# Patient Record
Sex: Female | Born: 1955
Health system: Southern US, Community
[De-identification: ages and names within clinical notes are randomized; demographics above are authoritative.]

## PROBLEM LIST (undated history)

## (undated) DIAGNOSIS — F419 Anxiety disorder, unspecified: Secondary | ICD-10-CM

## (undated) DIAGNOSIS — E119 Type 2 diabetes mellitus without complications: Secondary | ICD-10-CM

## (undated) DIAGNOSIS — J45909 Unspecified asthma, uncomplicated: Secondary | ICD-10-CM

## (undated) DIAGNOSIS — I10 Essential (primary) hypertension: Secondary | ICD-10-CM

## (undated) DIAGNOSIS — F329 Major depressive disorder, single episode, unspecified: Secondary | ICD-10-CM

## (undated) DIAGNOSIS — E785 Hyperlipidemia, unspecified: Secondary | ICD-10-CM

## (undated) DIAGNOSIS — K7682 Hepatic encephalopathy: Secondary | ICD-10-CM

## (undated) DIAGNOSIS — J449 Chronic obstructive pulmonary disease, unspecified: Secondary | ICD-10-CM

## (undated) DIAGNOSIS — E559 Vitamin D deficiency, unspecified: Secondary | ICD-10-CM

## (undated) DIAGNOSIS — K7290 Hepatic failure, unspecified without coma: Secondary | ICD-10-CM

## (undated) DIAGNOSIS — K219 Gastro-esophageal reflux disease without esophagitis: Secondary | ICD-10-CM

## (undated) DIAGNOSIS — R413 Other amnesia: Secondary | ICD-10-CM

## (undated) DIAGNOSIS — K7030 Alcoholic cirrhosis of liver without ascites: Secondary | ICD-10-CM

## (undated) DIAGNOSIS — Z5189 Encounter for other specified aftercare: Secondary | ICD-10-CM

## (undated) DIAGNOSIS — I639 Cerebral infarction, unspecified: Secondary | ICD-10-CM

## (undated) DIAGNOSIS — M5412 Radiculopathy, cervical region: Secondary | ICD-10-CM

## (undated) DIAGNOSIS — D649 Anemia, unspecified: Secondary | ICD-10-CM

## (undated) DIAGNOSIS — F1910 Other psychoactive substance abuse, uncomplicated: Secondary | ICD-10-CM

## (undated) DIAGNOSIS — M8580 Other specified disorders of bone density and structure, unspecified site: Secondary | ICD-10-CM

## (undated) DIAGNOSIS — I669 Occlusion and stenosis of unspecified cerebral artery: Secondary | ICD-10-CM

## (undated) DIAGNOSIS — E039 Hypothyroidism, unspecified: Secondary | ICD-10-CM

## (undated) DIAGNOSIS — F32A Depression, unspecified: Secondary | ICD-10-CM

## (undated) HISTORY — DX: Encounter for other specified aftercare: Z51.89

## (undated) HISTORY — DX: Depression, unspecified: F32.A

## (undated) HISTORY — DX: Major depressive disorder, single episode, unspecified: F32.9

## (undated) HISTORY — DX: Gastro-esophageal reflux disease without esophagitis: K21.9

## (undated) HISTORY — DX: Hepatic encephalopathy: K76.82

## (undated) HISTORY — DX: Anemia, unspecified: D64.9

## (undated) HISTORY — DX: Other amnesia: R41.3

## (undated) HISTORY — PX: UPPER GASTROINTESTINAL ENDOSCOPY: SHX188

## (undated) HISTORY — DX: Unspecified asthma, uncomplicated: J45.909

## (undated) HISTORY — DX: Cerebral infarction, unspecified: I63.9

## (undated) HISTORY — DX: Other psychoactive substance abuse, uncomplicated: F19.10

## (undated) HISTORY — DX: Hypothyroidism, unspecified: E03.9

## (undated) HISTORY — DX: Hyperlipidemia, unspecified: E78.5

## (undated) HISTORY — DX: Anxiety disorder, unspecified: F41.9

## (undated) HISTORY — DX: Vitamin D deficiency, unspecified: E55.9

## (undated) HISTORY — PX: CHOLECYSTECTOMY: SHX55

## (undated) HISTORY — DX: Chronic obstructive pulmonary disease, unspecified: J44.9

## (undated) HISTORY — DX: Other specified disorders of bone density and structure, unspecified site: M85.80

## (undated) HISTORY — DX: Radiculopathy, cervical region: M54.12

## (undated) HISTORY — DX: Type 2 diabetes mellitus without complications: E11.9

## (undated) HISTORY — DX: Hepatic failure, unspecified without coma: K72.90

## (undated) HISTORY — PX: POLYPECTOMY: SHX149

## (undated) HISTORY — DX: Essential (primary) hypertension: I10

## (undated) HISTORY — PX: BACK SURGERY: SHX140

## (undated) HISTORY — DX: Alcoholic cirrhosis of liver without ascites: K70.30

## (undated) HISTORY — PX: LIVER BIOPSY: SHX301

## (undated) HISTORY — DX: Occlusion and stenosis of unspecified cerebral artery: I66.9

---

## 2001-01-05 ENCOUNTER — Inpatient Hospital Stay (HOSPITAL_COMMUNITY): Admission: EM | Admit: 2001-01-05 | Discharge: 2001-01-10 | Payer: Self-pay | Admitting: Psychiatry

## 2005-04-18 ENCOUNTER — Ambulatory Visit (HOSPITAL_COMMUNITY): Admission: RE | Admit: 2005-04-18 | Discharge: 2005-04-18 | Payer: Self-pay | Admitting: Neurosurgery

## 2005-06-12 ENCOUNTER — Encounter: Admission: RE | Admit: 2005-06-12 | Discharge: 2005-06-12 | Payer: Self-pay | Admitting: Neurosurgery

## 2005-09-11 ENCOUNTER — Encounter: Admission: RE | Admit: 2005-09-11 | Discharge: 2005-09-11 | Payer: Self-pay | Admitting: Neurosurgery

## 2005-09-13 ENCOUNTER — Encounter: Admission: RE | Admit: 2005-09-13 | Discharge: 2005-09-13 | Payer: Self-pay | Admitting: Neurosurgery

## 2006-12-13 IMAGING — CR DG CHEST 2V
2 series · 2 of 2 positions shown · non-contrast
Comparison: none

<!--  IDXRADR:ADDEND:BEGIN -->Addendum Begins<!--  IDXRADR:ADDEND:INNER_BEGIN -->Original report dictated by Dr. Cheree.  Addendum dictated by Dr. Cheree.
 There is a vague nodular opacity at the medial left lung zone overlying the heart shadow seen only on the frontal view.  Repeat deeper penetrated PA chest x-ray is recommended and if this area persists then CT of the chest may be warranted.

[view not recorded (1 of 2)]
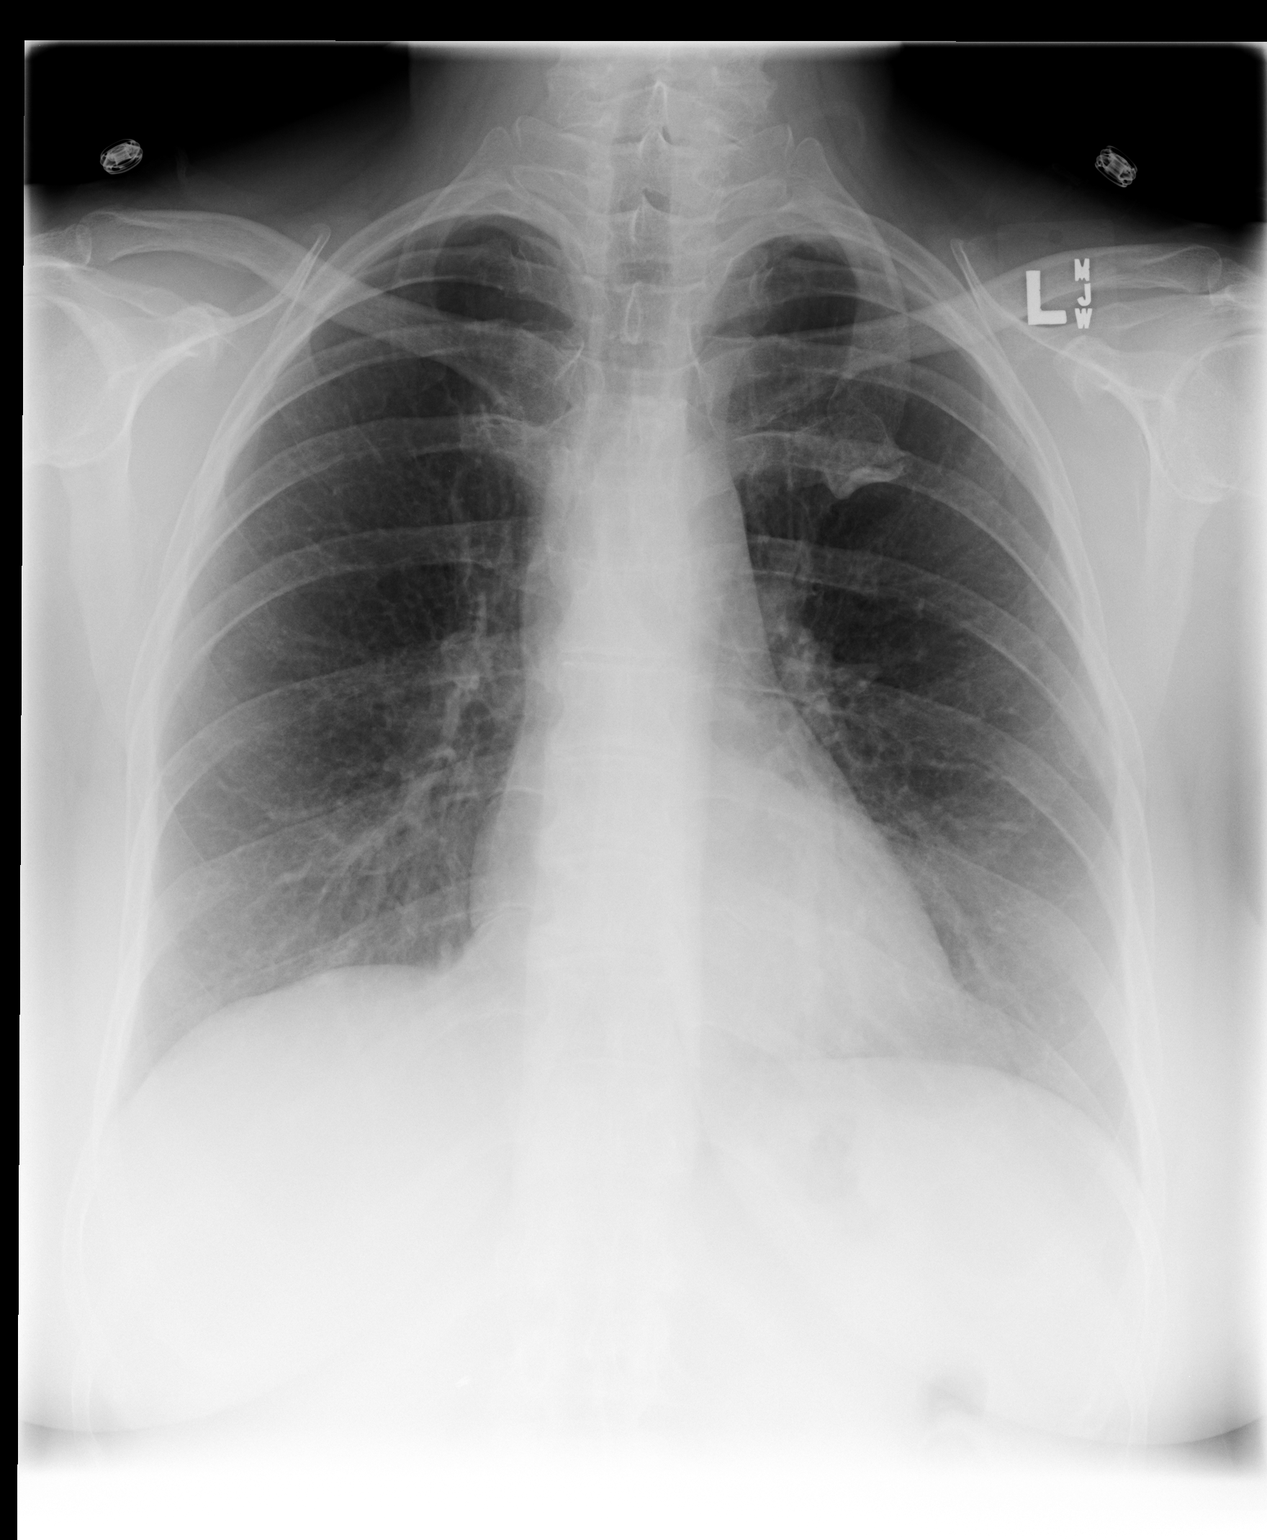

[view not recorded (2 of 2)]
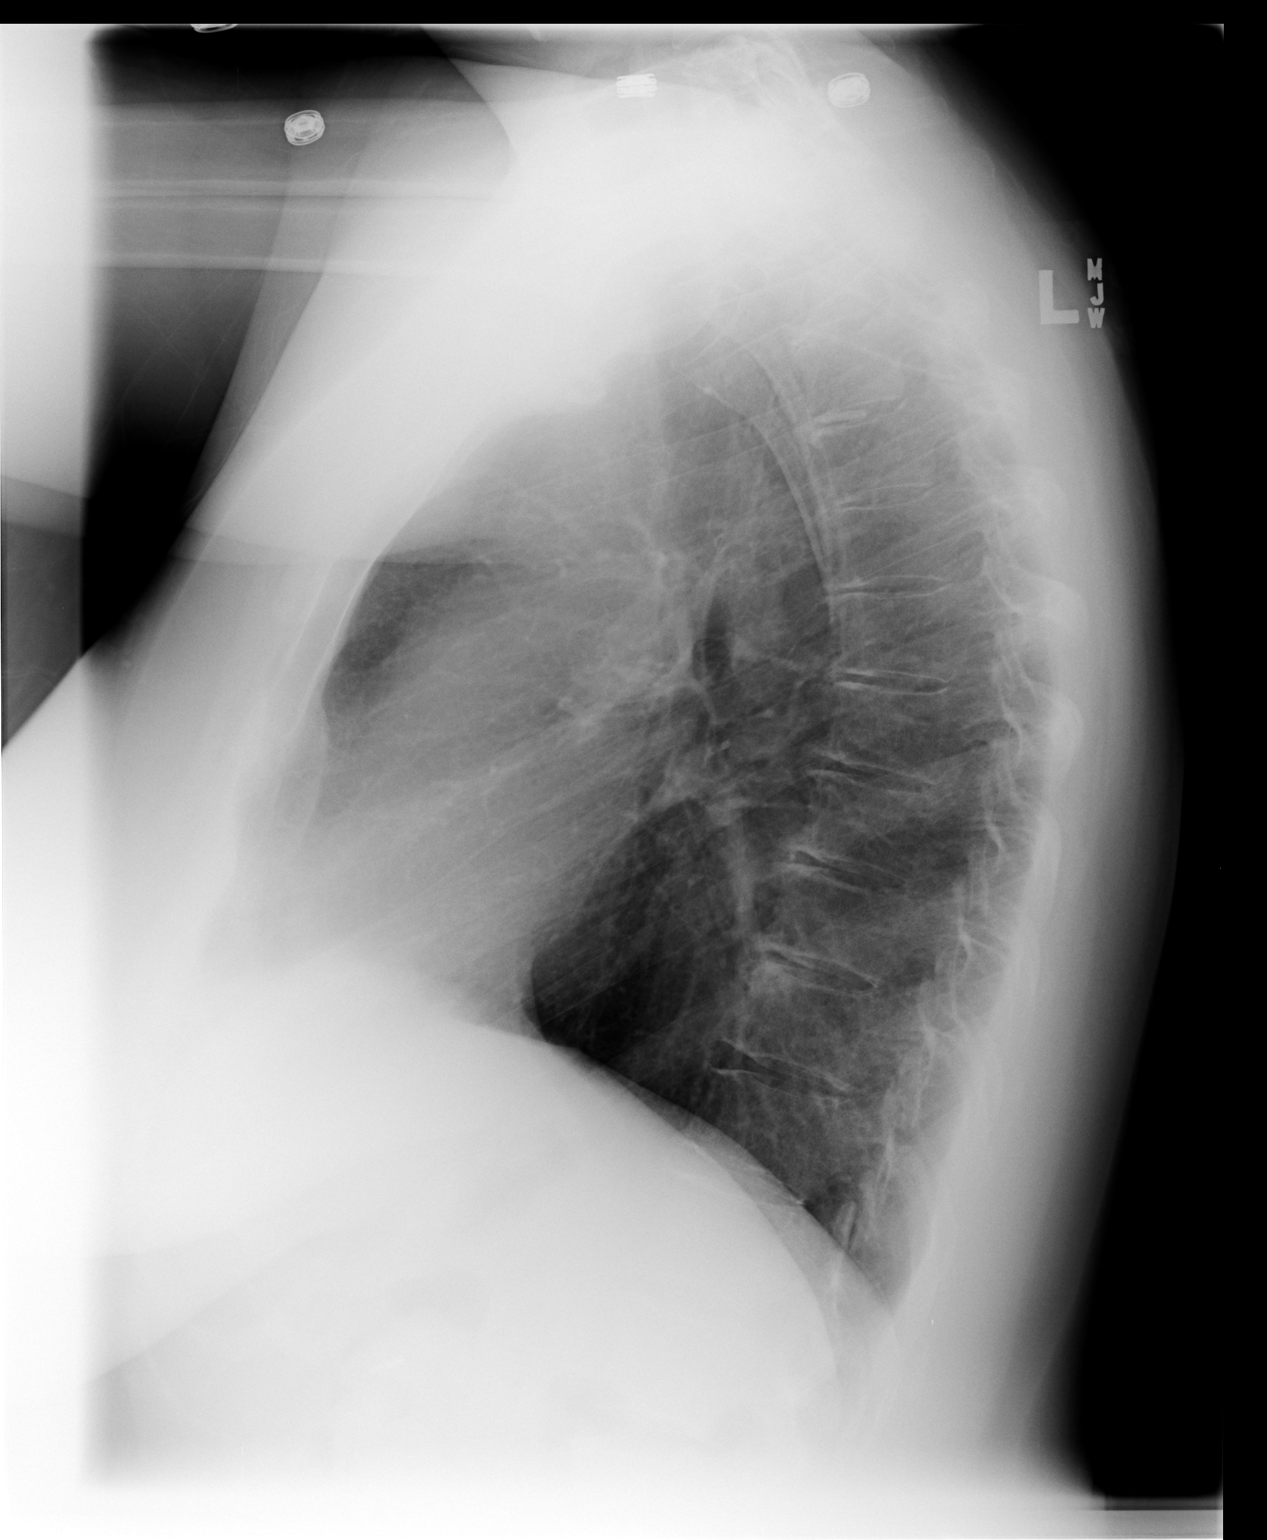

[2 of 2 positions shown; findings below may reference images not displayed]

IMPRESSION: Questionable nodule at the left lung base medially.  Suggest repeat PA chest x-ray with more penetration.  

 <!--  IDXRADR:ADDEND:INNER_END -->Addendum Ends
<!--  IDXRADR:ADDEND:END -->Clinical data:    Preop for herniated disc surgery. 
 CHEST - 2 VIEW:
FINDINGS: Two views of the chest show the lungs to be clear and slightly hyperaerated.  The heart is within normal limits in size.  No bony abnormality is seen.
IMPRESSION: No active lung disease.

## 2007-04-10 HISTORY — PX: OTHER SURGICAL HISTORY: SHX169

## 2009-04-09 HISTORY — PX: APPENDECTOMY: SHX54

## 2013-08-12 HISTORY — PX: COLONOSCOPY: SHX174

## 2014-08-26 HISTORY — PX: ESOPHAGOGASTRODUODENOSCOPY: SHX1529

## 2015-04-11 DIAGNOSIS — K2100 Gastro-esophageal reflux disease with esophagitis, without bleeding: Secondary | ICD-10-CM | POA: Insufficient documentation

## 2015-04-11 DIAGNOSIS — R350 Frequency of micturition: Secondary | ICD-10-CM | POA: Insufficient documentation

## 2015-04-11 DIAGNOSIS — I10 Essential (primary) hypertension: Secondary | ICD-10-CM | POA: Insufficient documentation

## 2015-04-11 DIAGNOSIS — E1151 Type 2 diabetes mellitus with diabetic peripheral angiopathy without gangrene: Secondary | ICD-10-CM | POA: Insufficient documentation

## 2015-04-11 DIAGNOSIS — E871 Hypo-osmolality and hyponatremia: Secondary | ICD-10-CM | POA: Insufficient documentation

## 2015-04-11 DIAGNOSIS — K7460 Unspecified cirrhosis of liver: Secondary | ICD-10-CM | POA: Insufficient documentation

## 2015-04-11 DIAGNOSIS — M169 Osteoarthritis of hip, unspecified: Secondary | ICD-10-CM | POA: Insufficient documentation

## 2015-04-11 DIAGNOSIS — H5203 Hypermetropia, bilateral: Secondary | ICD-10-CM | POA: Insufficient documentation

## 2015-04-11 DIAGNOSIS — H52221 Regular astigmatism, right eye: Secondary | ICD-10-CM | POA: Insufficient documentation

## 2015-04-11 DIAGNOSIS — F339 Major depressive disorder, recurrent, unspecified: Secondary | ICD-10-CM | POA: Insufficient documentation

## 2015-04-11 DIAGNOSIS — F419 Anxiety disorder, unspecified: Secondary | ICD-10-CM | POA: Insufficient documentation

## 2015-04-11 DIAGNOSIS — F1019 Alcohol abuse with unspecified alcohol-induced disorder: Secondary | ICD-10-CM | POA: Insufficient documentation

## 2015-04-11 DIAGNOSIS — E785 Hyperlipidemia, unspecified: Secondary | ICD-10-CM | POA: Insufficient documentation

## 2015-04-11 DIAGNOSIS — M8580 Other specified disorders of bone density and structure, unspecified site: Secondary | ICD-10-CM | POA: Insufficient documentation

## 2015-04-11 DIAGNOSIS — N951 Menopausal and female climacteric states: Secondary | ICD-10-CM | POA: Insufficient documentation

## 2015-04-11 DIAGNOSIS — H524 Presbyopia: Secondary | ICD-10-CM | POA: Insufficient documentation

## 2015-04-11 DIAGNOSIS — H269 Unspecified cataract: Secondary | ICD-10-CM | POA: Insufficient documentation

## 2015-04-11 DIAGNOSIS — G5600 Carpal tunnel syndrome, unspecified upper limb: Secondary | ICD-10-CM | POA: Insufficient documentation

## 2015-04-11 DIAGNOSIS — F17200 Nicotine dependence, unspecified, uncomplicated: Secondary | ICD-10-CM | POA: Insufficient documentation

## 2015-04-11 DIAGNOSIS — G459 Transient cerebral ischemic attack, unspecified: Secondary | ICD-10-CM | POA: Insufficient documentation

## 2015-04-11 DIAGNOSIS — R413 Other amnesia: Secondary | ICD-10-CM | POA: Insufficient documentation

## 2015-04-11 DIAGNOSIS — E559 Vitamin D deficiency, unspecified: Secondary | ICD-10-CM | POA: Insufficient documentation

## 2015-04-11 DIAGNOSIS — J449 Chronic obstructive pulmonary disease, unspecified: Secondary | ICD-10-CM | POA: Insufficient documentation

## 2015-04-11 DIAGNOSIS — E039 Hypothyroidism, unspecified: Secondary | ICD-10-CM | POA: Insufficient documentation

## 2015-04-11 DIAGNOSIS — J309 Allergic rhinitis, unspecified: Secondary | ICD-10-CM | POA: Insufficient documentation

## 2015-04-11 DIAGNOSIS — M5416 Radiculopathy, lumbar region: Secondary | ICD-10-CM | POA: Insufficient documentation

## 2015-04-11 DIAGNOSIS — J4489 Other specified chronic obstructive pulmonary disease: Secondary | ICD-10-CM | POA: Insufficient documentation

## 2015-04-11 DIAGNOSIS — M5412 Radiculopathy, cervical region: Secondary | ICD-10-CM | POA: Insufficient documentation

## 2015-04-13 DIAGNOSIS — F339 Major depressive disorder, recurrent, unspecified: Secondary | ICD-10-CM | POA: Diagnosis not present

## 2015-04-13 DIAGNOSIS — F419 Anxiety disorder, unspecified: Secondary | ICD-10-CM | POA: Diagnosis not present

## 2015-04-13 DIAGNOSIS — E1165 Type 2 diabetes mellitus with hyperglycemia: Secondary | ICD-10-CM | POA: Diagnosis not present

## 2015-04-13 DIAGNOSIS — E559 Vitamin D deficiency, unspecified: Secondary | ICD-10-CM | POA: Diagnosis not present

## 2015-04-13 DIAGNOSIS — J3089 Other allergic rhinitis: Secondary | ICD-10-CM | POA: Diagnosis not present

## 2015-04-13 DIAGNOSIS — J410 Simple chronic bronchitis: Secondary | ICD-10-CM | POA: Diagnosis not present

## 2015-04-13 DIAGNOSIS — Z0000 Encounter for general adult medical examination without abnormal findings: Secondary | ICD-10-CM | POA: Diagnosis not present

## 2015-04-13 DIAGNOSIS — M8580 Other specified disorders of bone density and structure, unspecified site: Secondary | ICD-10-CM | POA: Diagnosis not present

## 2015-04-13 DIAGNOSIS — F1019 Alcohol abuse with unspecified alcohol-induced disorder: Secondary | ICD-10-CM | POA: Diagnosis not present

## 2015-04-13 DIAGNOSIS — G609 Hereditary and idiopathic neuropathy, unspecified: Secondary | ICD-10-CM | POA: Diagnosis not present

## 2015-04-13 DIAGNOSIS — I10 Essential (primary) hypertension: Secondary | ICD-10-CM | POA: Diagnosis not present

## 2015-04-13 DIAGNOSIS — K7460 Unspecified cirrhosis of liver: Secondary | ICD-10-CM | POA: Diagnosis not present

## 2015-04-13 DIAGNOSIS — I663 Occlusion and stenosis of cerebellar arteries: Secondary | ICD-10-CM | POA: Diagnosis not present

## 2015-04-13 DIAGNOSIS — K210 Gastro-esophageal reflux disease with esophagitis: Secondary | ICD-10-CM | POA: Diagnosis not present

## 2015-04-13 DIAGNOSIS — E038 Other specified hypothyroidism: Secondary | ICD-10-CM | POA: Diagnosis not present

## 2015-04-13 DIAGNOSIS — Z23 Encounter for immunization: Secondary | ICD-10-CM | POA: Diagnosis not present

## 2015-04-13 DIAGNOSIS — Z794 Long term (current) use of insulin: Secondary | ICD-10-CM | POA: Diagnosis not present

## 2015-04-13 DIAGNOSIS — E782 Mixed hyperlipidemia: Secondary | ICD-10-CM | POA: Diagnosis not present

## 2015-04-19 DIAGNOSIS — E538 Deficiency of other specified B group vitamins: Secondary | ICD-10-CM | POA: Insufficient documentation

## 2015-04-21 DIAGNOSIS — Z1231 Encounter for screening mammogram for malignant neoplasm of breast: Secondary | ICD-10-CM | POA: Diagnosis not present

## 2015-04-21 DIAGNOSIS — Z780 Asymptomatic menopausal state: Secondary | ICD-10-CM | POA: Diagnosis not present

## 2015-04-21 DIAGNOSIS — Z13820 Encounter for screening for osteoporosis: Secondary | ICD-10-CM | POA: Diagnosis not present

## 2015-04-21 DIAGNOSIS — M8589 Other specified disorders of bone density and structure, multiple sites: Secondary | ICD-10-CM | POA: Diagnosis not present

## 2015-04-29 DIAGNOSIS — J449 Chronic obstructive pulmonary disease, unspecified: Secondary | ICD-10-CM | POA: Diagnosis not present

## 2015-08-19 DIAGNOSIS — E538 Deficiency of other specified B group vitamins: Secondary | ICD-10-CM | POA: Diagnosis not present

## 2015-08-19 DIAGNOSIS — E782 Mixed hyperlipidemia: Secondary | ICD-10-CM | POA: Diagnosis not present

## 2015-08-19 DIAGNOSIS — E559 Vitamin D deficiency, unspecified: Secondary | ICD-10-CM | POA: Diagnosis not present

## 2015-08-25 DIAGNOSIS — F1019 Alcohol abuse with unspecified alcohol-induced disorder: Secondary | ICD-10-CM | POA: Diagnosis not present

## 2015-08-25 DIAGNOSIS — Z794 Long term (current) use of insulin: Secondary | ICD-10-CM | POA: Diagnosis not present

## 2015-08-25 DIAGNOSIS — R778 Other specified abnormalities of plasma proteins: Secondary | ICD-10-CM | POA: Diagnosis not present

## 2015-08-25 DIAGNOSIS — F17200 Nicotine dependence, unspecified, uncomplicated: Secondary | ICD-10-CM | POA: Diagnosis not present

## 2015-08-25 DIAGNOSIS — E1165 Type 2 diabetes mellitus with hyperglycemia: Secondary | ICD-10-CM | POA: Diagnosis not present

## 2015-08-25 DIAGNOSIS — E782 Mixed hyperlipidemia: Secondary | ICD-10-CM | POA: Diagnosis not present

## 2015-08-25 DIAGNOSIS — E559 Vitamin D deficiency, unspecified: Secondary | ICD-10-CM | POA: Diagnosis not present

## 2015-08-25 DIAGNOSIS — R945 Abnormal results of liver function studies: Secondary | ICD-10-CM | POA: Diagnosis not present

## 2015-08-25 DIAGNOSIS — E538 Deficiency of other specified B group vitamins: Secondary | ICD-10-CM | POA: Diagnosis not present

## 2015-11-08 DIAGNOSIS — H16213 Exposure keratoconjunctivitis, bilateral: Secondary | ICD-10-CM | POA: Insufficient documentation

## 2015-11-08 DIAGNOSIS — H25013 Cortical age-related cataract, bilateral: Secondary | ICD-10-CM | POA: Insufficient documentation

## 2015-11-08 DIAGNOSIS — H02834 Dermatochalasis of left upper eyelid: Secondary | ICD-10-CM

## 2015-11-08 DIAGNOSIS — H2513 Age-related nuclear cataract, bilateral: Secondary | ICD-10-CM | POA: Insufficient documentation

## 2015-11-08 DIAGNOSIS — H02831 Dermatochalasis of right upper eyelid: Secondary | ICD-10-CM | POA: Insufficient documentation

## 2015-11-17 DIAGNOSIS — E1165 Type 2 diabetes mellitus with hyperglycemia: Secondary | ICD-10-CM | POA: Diagnosis not present

## 2015-11-17 DIAGNOSIS — Z794 Long term (current) use of insulin: Secondary | ICD-10-CM | POA: Diagnosis not present

## 2015-12-01 DIAGNOSIS — I672 Cerebral atherosclerosis: Secondary | ICD-10-CM | POA: Diagnosis not present

## 2015-12-01 DIAGNOSIS — L821 Other seborrheic keratosis: Secondary | ICD-10-CM | POA: Diagnosis not present

## 2015-12-01 DIAGNOSIS — I739 Peripheral vascular disease, unspecified: Secondary | ICD-10-CM

## 2015-12-01 DIAGNOSIS — E1151 Type 2 diabetes mellitus with diabetic peripheral angiopathy without gangrene: Secondary | ICD-10-CM | POA: Diagnosis not present

## 2015-12-01 DIAGNOSIS — I779 Disorder of arteries and arterioles, unspecified: Secondary | ICD-10-CM | POA: Diagnosis not present

## 2016-02-17 DIAGNOSIS — R1310 Dysphagia, unspecified: Secondary | ICD-10-CM | POA: Diagnosis not present

## 2016-02-17 DIAGNOSIS — K7460 Unspecified cirrhosis of liver: Secondary | ICD-10-CM | POA: Diagnosis not present

## 2016-02-17 DIAGNOSIS — J029 Acute pharyngitis, unspecified: Secondary | ICD-10-CM | POA: Diagnosis not present

## 2016-02-17 DIAGNOSIS — E039 Hypothyroidism, unspecified: Secondary | ICD-10-CM | POA: Diagnosis not present

## 2016-02-17 DIAGNOSIS — I10 Essential (primary) hypertension: Secondary | ICD-10-CM | POA: Diagnosis not present

## 2016-03-10 DIAGNOSIS — E785 Hyperlipidemia, unspecified: Secondary | ICD-10-CM | POA: Diagnosis not present

## 2016-03-10 DIAGNOSIS — E138 Other specified diabetes mellitus with unspecified complications: Secondary | ICD-10-CM | POA: Diagnosis not present

## 2016-03-21 DIAGNOSIS — I10 Essential (primary) hypertension: Secondary | ICD-10-CM | POA: Diagnosis not present

## 2016-03-21 DIAGNOSIS — E1159 Type 2 diabetes mellitus with other circulatory complications: Secondary | ICD-10-CM | POA: Diagnosis not present

## 2016-03-21 DIAGNOSIS — E1165 Type 2 diabetes mellitus with hyperglycemia: Secondary | ICD-10-CM | POA: Diagnosis not present

## 2016-03-21 DIAGNOSIS — E782 Mixed hyperlipidemia: Secondary | ICD-10-CM | POA: Diagnosis not present

## 2016-03-21 DIAGNOSIS — Z794 Long term (current) use of insulin: Secondary | ICD-10-CM | POA: Diagnosis not present

## 2016-03-21 DIAGNOSIS — K7290 Hepatic failure, unspecified without coma: Secondary | ICD-10-CM | POA: Diagnosis not present

## 2016-03-21 DIAGNOSIS — K7682 Hepatic encephalopathy: Secondary | ICD-10-CM | POA: Insufficient documentation

## 2016-04-10 DIAGNOSIS — K3189 Other diseases of stomach and duodenum: Secondary | ICD-10-CM | POA: Diagnosis not present

## 2016-04-10 DIAGNOSIS — K7290 Hepatic failure, unspecified without coma: Secondary | ICD-10-CM | POA: Diagnosis not present

## 2016-04-10 DIAGNOSIS — K7030 Alcoholic cirrhosis of liver without ascites: Secondary | ICD-10-CM | POA: Diagnosis not present

## 2016-04-17 DIAGNOSIS — K7030 Alcoholic cirrhosis of liver without ascites: Secondary | ICD-10-CM | POA: Diagnosis not present

## 2016-04-17 DIAGNOSIS — K7460 Unspecified cirrhosis of liver: Secondary | ICD-10-CM | POA: Diagnosis not present

## 2016-04-17 DIAGNOSIS — Z9049 Acquired absence of other specified parts of digestive tract: Secondary | ICD-10-CM | POA: Diagnosis not present

## 2016-05-08 DIAGNOSIS — K7030 Alcoholic cirrhosis of liver without ascites: Secondary | ICD-10-CM | POA: Diagnosis not present

## 2016-06-26 DIAGNOSIS — M8580 Other specified disorders of bone density and structure, unspecified site: Secondary | ICD-10-CM | POA: Diagnosis not present

## 2016-06-26 DIAGNOSIS — E782 Mixed hyperlipidemia: Secondary | ICD-10-CM | POA: Diagnosis not present

## 2016-06-26 DIAGNOSIS — Z124 Encounter for screening for malignant neoplasm of cervix: Secondary | ICD-10-CM | POA: Diagnosis not present

## 2016-06-26 DIAGNOSIS — E559 Vitamin D deficiency, unspecified: Secondary | ICD-10-CM | POA: Diagnosis not present

## 2016-06-26 DIAGNOSIS — Z1231 Encounter for screening mammogram for malignant neoplasm of breast: Secondary | ICD-10-CM | POA: Diagnosis not present

## 2016-06-26 DIAGNOSIS — Z23 Encounter for immunization: Secondary | ICD-10-CM | POA: Diagnosis not present

## 2016-06-26 DIAGNOSIS — M5416 Radiculopathy, lumbar region: Secondary | ICD-10-CM | POA: Diagnosis not present

## 2016-06-26 DIAGNOSIS — I10 Essential (primary) hypertension: Secondary | ICD-10-CM | POA: Diagnosis not present

## 2016-06-26 DIAGNOSIS — K7290 Hepatic failure, unspecified without coma: Secondary | ICD-10-CM | POA: Diagnosis not present

## 2016-06-26 DIAGNOSIS — F1019 Alcohol abuse with unspecified alcohol-induced disorder: Secondary | ICD-10-CM | POA: Diagnosis not present

## 2016-06-26 DIAGNOSIS — E1159 Type 2 diabetes mellitus with other circulatory complications: Secondary | ICD-10-CM | POA: Diagnosis not present

## 2016-06-26 DIAGNOSIS — K210 Gastro-esophageal reflux disease with esophagitis: Secondary | ICD-10-CM | POA: Diagnosis not present

## 2016-06-26 DIAGNOSIS — J410 Simple chronic bronchitis: Secondary | ICD-10-CM | POA: Diagnosis not present

## 2016-06-26 DIAGNOSIS — F419 Anxiety disorder, unspecified: Secondary | ICD-10-CM | POA: Diagnosis not present

## 2016-06-26 DIAGNOSIS — Z0000 Encounter for general adult medical examination without abnormal findings: Secondary | ICD-10-CM | POA: Diagnosis not present

## 2016-06-26 DIAGNOSIS — K7460 Unspecified cirrhosis of liver: Secondary | ICD-10-CM | POA: Diagnosis not present

## 2016-06-26 DIAGNOSIS — E038 Other specified hypothyroidism: Secondary | ICD-10-CM | POA: Diagnosis not present

## 2016-06-26 DIAGNOSIS — E538 Deficiency of other specified B group vitamins: Secondary | ICD-10-CM | POA: Diagnosis not present

## 2016-06-26 DIAGNOSIS — I779 Disorder of arteries and arterioles, unspecified: Secondary | ICD-10-CM | POA: Diagnosis not present

## 2016-06-27 DIAGNOSIS — J449 Chronic obstructive pulmonary disease, unspecified: Secondary | ICD-10-CM | POA: Diagnosis not present

## 2016-07-13 DIAGNOSIS — Z13820 Encounter for screening for osteoporosis: Secondary | ICD-10-CM | POA: Diagnosis not present

## 2016-07-13 DIAGNOSIS — M8588 Other specified disorders of bone density and structure, other site: Secondary | ICD-10-CM | POA: Diagnosis not present

## 2016-07-13 DIAGNOSIS — Z1231 Encounter for screening mammogram for malignant neoplasm of breast: Secondary | ICD-10-CM | POA: Diagnosis not present

## 2016-07-13 DIAGNOSIS — R778 Other specified abnormalities of plasma proteins: Secondary | ICD-10-CM | POA: Diagnosis not present

## 2016-08-01 DIAGNOSIS — C44722 Squamous cell carcinoma of skin of right lower limb, including hip: Secondary | ICD-10-CM | POA: Diagnosis not present

## 2016-08-03 DIAGNOSIS — J069 Acute upper respiratory infection, unspecified: Secondary | ICD-10-CM | POA: Diagnosis not present

## 2016-08-03 DIAGNOSIS — J410 Simple chronic bronchitis: Secondary | ICD-10-CM | POA: Diagnosis not present

## 2016-08-11 DIAGNOSIS — B370 Candidal stomatitis: Secondary | ICD-10-CM | POA: Diagnosis not present

## 2016-10-03 DIAGNOSIS — R1310 Dysphagia, unspecified: Secondary | ICD-10-CM | POA: Diagnosis not present

## 2016-10-03 DIAGNOSIS — K219 Gastro-esophageal reflux disease without esophagitis: Secondary | ICD-10-CM | POA: Diagnosis not present

## 2016-10-03 DIAGNOSIS — F17200 Nicotine dependence, unspecified, uncomplicated: Secondary | ICD-10-CM | POA: Diagnosis not present

## 2016-10-03 DIAGNOSIS — R490 Dysphonia: Secondary | ICD-10-CM | POA: Diagnosis not present

## 2016-10-03 DIAGNOSIS — J383 Other diseases of vocal cords: Secondary | ICD-10-CM | POA: Diagnosis not present

## 2016-10-09 DIAGNOSIS — J342 Deviated nasal septum: Secondary | ICD-10-CM | POA: Diagnosis not present

## 2016-10-09 DIAGNOSIS — K219 Gastro-esophageal reflux disease without esophagitis: Secondary | ICD-10-CM | POA: Diagnosis not present

## 2016-10-09 DIAGNOSIS — R490 Dysphonia: Secondary | ICD-10-CM | POA: Diagnosis not present

## 2016-10-09 DIAGNOSIS — J439 Emphysema, unspecified: Secondary | ICD-10-CM | POA: Diagnosis not present

## 2016-10-09 DIAGNOSIS — D380 Neoplasm of uncertain behavior of larynx: Secondary | ICD-10-CM | POA: Diagnosis not present

## 2016-10-09 DIAGNOSIS — J384 Edema of larynx: Secondary | ICD-10-CM | POA: Diagnosis not present

## 2016-10-09 DIAGNOSIS — F17200 Nicotine dependence, unspecified, uncomplicated: Secondary | ICD-10-CM | POA: Diagnosis not present

## 2016-10-12 DIAGNOSIS — D380 Neoplasm of uncertain behavior of larynx: Secondary | ICD-10-CM | POA: Diagnosis not present

## 2016-10-12 DIAGNOSIS — J410 Simple chronic bronchitis: Secondary | ICD-10-CM | POA: Diagnosis not present

## 2016-10-12 DIAGNOSIS — K7460 Unspecified cirrhosis of liver: Secondary | ICD-10-CM | POA: Diagnosis not present

## 2016-10-12 DIAGNOSIS — Z01810 Encounter for preprocedural cardiovascular examination: Secondary | ICD-10-CM | POA: Diagnosis not present

## 2016-10-12 DIAGNOSIS — E1151 Type 2 diabetes mellitus with diabetic peripheral angiopathy without gangrene: Secondary | ICD-10-CM | POA: Diagnosis not present

## 2016-10-12 DIAGNOSIS — Z01818 Encounter for other preprocedural examination: Secondary | ICD-10-CM | POA: Diagnosis not present

## 2016-10-12 DIAGNOSIS — J449 Chronic obstructive pulmonary disease, unspecified: Secondary | ICD-10-CM | POA: Diagnosis not present

## 2016-10-29 DIAGNOSIS — E039 Hypothyroidism, unspecified: Secondary | ICD-10-CM | POA: Diagnosis not present

## 2016-10-29 DIAGNOSIS — Z9229 Personal history of other drug therapy: Secondary | ICD-10-CM | POA: Diagnosis not present

## 2016-10-29 DIAGNOSIS — I10 Essential (primary) hypertension: Secondary | ICD-10-CM | POA: Diagnosis not present

## 2016-11-05 DIAGNOSIS — J342 Deviated nasal septum: Secondary | ICD-10-CM | POA: Diagnosis not present

## 2016-11-05 DIAGNOSIS — F17200 Nicotine dependence, unspecified, uncomplicated: Secondary | ICD-10-CM | POA: Diagnosis not present

## 2016-11-05 DIAGNOSIS — R490 Dysphonia: Secondary | ICD-10-CM | POA: Diagnosis not present

## 2016-11-05 DIAGNOSIS — D380 Neoplasm of uncertain behavior of larynx: Secondary | ICD-10-CM | POA: Diagnosis not present

## 2016-11-14 DIAGNOSIS — E785 Hyperlipidemia, unspecified: Secondary | ICD-10-CM | POA: Diagnosis not present

## 2016-11-14 DIAGNOSIS — I10 Essential (primary) hypertension: Secondary | ICD-10-CM | POA: Diagnosis not present

## 2016-11-14 DIAGNOSIS — F17210 Nicotine dependence, cigarettes, uncomplicated: Secondary | ICD-10-CM | POA: Diagnosis not present

## 2016-11-14 DIAGNOSIS — Z8673 Personal history of transient ischemic attack (TIA), and cerebral infarction without residual deficits: Secondary | ICD-10-CM | POA: Diagnosis not present

## 2016-11-14 DIAGNOSIS — R490 Dysphonia: Secondary | ICD-10-CM | POA: Diagnosis not present

## 2016-11-14 DIAGNOSIS — J382 Nodules of vocal cords: Secondary | ICD-10-CM | POA: Diagnosis not present

## 2016-11-14 DIAGNOSIS — B49 Unspecified mycosis: Secondary | ICD-10-CM | POA: Diagnosis not present

## 2016-11-14 DIAGNOSIS — G8929 Other chronic pain: Secondary | ICD-10-CM | POA: Diagnosis not present

## 2016-11-14 DIAGNOSIS — J45909 Unspecified asthma, uncomplicated: Secondary | ICD-10-CM | POA: Diagnosis not present

## 2016-11-14 DIAGNOSIS — D380 Neoplasm of uncertain behavior of larynx: Secondary | ICD-10-CM | POA: Diagnosis not present

## 2016-11-14 DIAGNOSIS — J383 Other diseases of vocal cords: Secondary | ICD-10-CM | POA: Diagnosis not present

## 2016-11-14 DIAGNOSIS — K219 Gastro-esophageal reflux disease without esophagitis: Secondary | ICD-10-CM | POA: Diagnosis not present

## 2016-11-14 DIAGNOSIS — Z79899 Other long term (current) drug therapy: Secondary | ICD-10-CM | POA: Diagnosis not present

## 2016-11-14 DIAGNOSIS — M545 Low back pain: Secondary | ICD-10-CM | POA: Diagnosis not present

## 2016-11-20 DIAGNOSIS — R490 Dysphonia: Secondary | ICD-10-CM | POA: Diagnosis not present

## 2016-11-20 DIAGNOSIS — J383 Other diseases of vocal cords: Secondary | ICD-10-CM | POA: Diagnosis not present

## 2016-11-20 DIAGNOSIS — F17200 Nicotine dependence, unspecified, uncomplicated: Secondary | ICD-10-CM | POA: Diagnosis not present

## 2016-11-20 DIAGNOSIS — J342 Deviated nasal septum: Secondary | ICD-10-CM | POA: Diagnosis not present

## 2016-12-13 DIAGNOSIS — F339 Major depressive disorder, recurrent, unspecified: Secondary | ICD-10-CM | POA: Diagnosis not present

## 2016-12-13 DIAGNOSIS — E782 Mixed hyperlipidemia: Secondary | ICD-10-CM | POA: Diagnosis not present

## 2016-12-13 DIAGNOSIS — E1151 Type 2 diabetes mellitus with diabetic peripheral angiopathy without gangrene: Secondary | ICD-10-CM | POA: Diagnosis not present

## 2016-12-13 DIAGNOSIS — F419 Anxiety disorder, unspecified: Secondary | ICD-10-CM | POA: Diagnosis not present

## 2017-02-18 DIAGNOSIS — R490 Dysphonia: Secondary | ICD-10-CM | POA: Diagnosis not present

## 2017-02-18 DIAGNOSIS — J342 Deviated nasal septum: Secondary | ICD-10-CM | POA: Diagnosis not present

## 2017-02-18 DIAGNOSIS — J383 Other diseases of vocal cords: Secondary | ICD-10-CM | POA: Diagnosis not present

## 2017-02-18 DIAGNOSIS — F17200 Nicotine dependence, unspecified, uncomplicated: Secondary | ICD-10-CM | POA: Diagnosis not present

## 2017-03-27 DIAGNOSIS — H524 Presbyopia: Secondary | ICD-10-CM | POA: Diagnosis not present

## 2017-03-27 DIAGNOSIS — H5203 Hypermetropia, bilateral: Secondary | ICD-10-CM | POA: Diagnosis not present

## 2017-03-27 DIAGNOSIS — E1165 Type 2 diabetes mellitus with hyperglycemia: Secondary | ICD-10-CM | POA: Diagnosis not present

## 2017-03-27 DIAGNOSIS — H52223 Regular astigmatism, bilateral: Secondary | ICD-10-CM | POA: Diagnosis not present

## 2017-03-27 DIAGNOSIS — Z7984 Long term (current) use of oral hypoglycemic drugs: Secondary | ICD-10-CM | POA: Diagnosis not present

## 2017-03-27 DIAGNOSIS — H2513 Age-related nuclear cataract, bilateral: Secondary | ICD-10-CM | POA: Diagnosis not present

## 2017-03-27 DIAGNOSIS — H25013 Cortical age-related cataract, bilateral: Secondary | ICD-10-CM | POA: Diagnosis not present

## 2017-11-12 DIAGNOSIS — M450 Ankylosing spondylitis of multiple sites in spine: Secondary | ICD-10-CM | POA: Diagnosis not present

## 2017-11-12 DIAGNOSIS — K7290 Hepatic failure, unspecified without coma: Secondary | ICD-10-CM | POA: Diagnosis not present

## 2017-11-12 DIAGNOSIS — J449 Chronic obstructive pulmonary disease, unspecified: Secondary | ICD-10-CM | POA: Diagnosis not present

## 2017-11-12 DIAGNOSIS — E11649 Type 2 diabetes mellitus with hypoglycemia without coma: Secondary | ICD-10-CM | POA: Diagnosis not present

## 2017-11-12 DIAGNOSIS — F1021 Alcohol dependence, in remission: Secondary | ICD-10-CM | POA: Diagnosis not present

## 2017-11-12 DIAGNOSIS — N83209 Unspecified ovarian cyst, unspecified side: Secondary | ICD-10-CM | POA: Diagnosis not present

## 2017-11-12 DIAGNOSIS — R42 Dizziness and giddiness: Secondary | ICD-10-CM | POA: Diagnosis not present

## 2017-11-12 DIAGNOSIS — R296 Repeated falls: Secondary | ICD-10-CM | POA: Diagnosis not present

## 2017-11-12 DIAGNOSIS — N838 Other noninflammatory disorders of ovary, fallopian tube and broad ligament: Secondary | ICD-10-CM | POA: Diagnosis not present

## 2017-11-12 DIAGNOSIS — Z885 Allergy status to narcotic agent status: Secondary | ICD-10-CM | POA: Diagnosis not present

## 2017-11-12 DIAGNOSIS — E162 Hypoglycemia, unspecified: Secondary | ICD-10-CM | POA: Diagnosis not present

## 2017-11-12 DIAGNOSIS — K760 Fatty (change of) liver, not elsewhere classified: Secondary | ICD-10-CM | POA: Diagnosis not present

## 2017-11-12 DIAGNOSIS — E039 Hypothyroidism, unspecified: Secondary | ICD-10-CM | POA: Diagnosis not present

## 2017-11-12 DIAGNOSIS — R740 Nonspecific elevation of levels of transaminase and lactic acid dehydrogenase [LDH]: Secondary | ICD-10-CM | POA: Diagnosis not present

## 2017-11-12 DIAGNOSIS — K7031 Alcoholic cirrhosis of liver with ascites: Secondary | ICD-10-CM | POA: Diagnosis not present

## 2017-11-12 DIAGNOSIS — F17210 Nicotine dependence, cigarettes, uncomplicated: Secondary | ICD-10-CM | POA: Diagnosis not present

## 2017-11-12 DIAGNOSIS — Z8673 Personal history of transient ischemic attack (TIA), and cerebral infarction without residual deficits: Secondary | ICD-10-CM | POA: Diagnosis not present

## 2017-11-12 DIAGNOSIS — J323 Chronic sphenoidal sinusitis: Secondary | ICD-10-CM | POA: Diagnosis not present

## 2017-11-12 DIAGNOSIS — K7030 Alcoholic cirrhosis of liver without ascites: Secondary | ICD-10-CM | POA: Diagnosis not present

## 2017-11-12 DIAGNOSIS — I10 Essential (primary) hypertension: Secondary | ICD-10-CM | POA: Diagnosis not present

## 2017-11-12 DIAGNOSIS — E119 Type 2 diabetes mellitus without complications: Secondary | ICD-10-CM | POA: Diagnosis not present

## 2017-11-12 DIAGNOSIS — J90 Pleural effusion, not elsewhere classified: Secondary | ICD-10-CM | POA: Diagnosis not present

## 2017-11-12 DIAGNOSIS — D649 Anemia, unspecified: Secondary | ICD-10-CM | POA: Diagnosis not present

## 2017-11-12 DIAGNOSIS — Z79899 Other long term (current) drug therapy: Secondary | ICD-10-CM | POA: Diagnosis not present

## 2017-11-12 DIAGNOSIS — N83202 Unspecified ovarian cyst, left side: Secondary | ICD-10-CM | POA: Diagnosis not present

## 2017-11-12 DIAGNOSIS — G9341 Metabolic encephalopathy: Secondary | ICD-10-CM | POA: Diagnosis not present

## 2017-11-12 DIAGNOSIS — Z888 Allergy status to other drugs, medicaments and biological substances status: Secondary | ICD-10-CM | POA: Diagnosis not present

## 2017-11-12 DIAGNOSIS — F329 Major depressive disorder, single episode, unspecified: Secondary | ICD-10-CM | POA: Diagnosis not present

## 2017-11-18 DIAGNOSIS — E039 Hypothyroidism, unspecified: Secondary | ICD-10-CM | POA: Diagnosis not present

## 2017-11-18 DIAGNOSIS — J410 Simple chronic bronchitis: Secondary | ICD-10-CM | POA: Diagnosis not present

## 2017-11-18 DIAGNOSIS — E538 Deficiency of other specified B group vitamins: Secondary | ICD-10-CM | POA: Diagnosis not present

## 2017-11-18 DIAGNOSIS — D649 Anemia, unspecified: Secondary | ICD-10-CM | POA: Diagnosis not present

## 2017-11-18 DIAGNOSIS — K7030 Alcoholic cirrhosis of liver without ascites: Secondary | ICD-10-CM | POA: Diagnosis not present

## 2017-11-18 DIAGNOSIS — N839 Noninflammatory disorder of ovary, fallopian tube and broad ligament, unspecified: Secondary | ICD-10-CM | POA: Diagnosis not present

## 2017-11-18 DIAGNOSIS — F1029 Alcohol dependence with unspecified alcohol-induced disorder: Secondary | ICD-10-CM | POA: Diagnosis not present

## 2017-11-18 DIAGNOSIS — I10 Essential (primary) hypertension: Secondary | ICD-10-CM | POA: Diagnosis not present

## 2017-11-18 DIAGNOSIS — R4182 Altered mental status, unspecified: Secondary | ICD-10-CM | POA: Diagnosis not present

## 2017-11-18 DIAGNOSIS — R971 Elevated cancer antigen 125 [CA 125]: Secondary | ICD-10-CM | POA: Diagnosis not present

## 2017-11-18 DIAGNOSIS — G9341 Metabolic encephalopathy: Secondary | ICD-10-CM | POA: Diagnosis not present

## 2017-11-18 DIAGNOSIS — R296 Repeated falls: Secondary | ICD-10-CM | POA: Diagnosis not present

## 2017-11-18 DIAGNOSIS — E11649 Type 2 diabetes mellitus with hypoglycemia without coma: Secondary | ICD-10-CM | POA: Diagnosis not present

## 2017-11-22 DIAGNOSIS — Z803 Family history of malignant neoplasm of breast: Secondary | ICD-10-CM | POA: Diagnosis not present

## 2017-11-22 DIAGNOSIS — Z1231 Encounter for screening mammogram for malignant neoplasm of breast: Secondary | ICD-10-CM | POA: Diagnosis not present

## 2017-11-26 DIAGNOSIS — K921 Melena: Secondary | ICD-10-CM | POA: Diagnosis not present

## 2017-11-26 DIAGNOSIS — M47812 Spondylosis without myelopathy or radiculopathy, cervical region: Secondary | ICD-10-CM | POA: Diagnosis not present

## 2017-11-26 DIAGNOSIS — Z1212 Encounter for screening for malignant neoplasm of rectum: Secondary | ICD-10-CM | POA: Diagnosis not present

## 2017-11-26 DIAGNOSIS — E871 Hypo-osmolality and hyponatremia: Secondary | ICD-10-CM | POA: Diagnosis not present

## 2017-11-26 DIAGNOSIS — K7290 Hepatic failure, unspecified without coma: Secondary | ICD-10-CM | POA: Diagnosis not present

## 2017-11-26 DIAGNOSIS — D649 Anemia, unspecified: Secondary | ICD-10-CM | POA: Insufficient documentation

## 2017-11-26 DIAGNOSIS — G629 Polyneuropathy, unspecified: Secondary | ICD-10-CM | POA: Insufficient documentation

## 2017-12-02 ENCOUNTER — Encounter: Payer: Self-pay | Admitting: Gastroenterology

## 2017-12-04 ENCOUNTER — Other Ambulatory Visit: Payer: Self-pay | Admitting: *Deleted

## 2017-12-06 NOTE — Patient Outreach (Signed)
[facility]) Care Management  12/06/2017  Chelsea Novak 23-Jun-1955 [id number]  Referral via [place]; patient was discharged from inpatient admission from [facility] 11/14/2017:  Telephone call to patient who was advised of reason for call and [facility] care management services. HIP[employer] verification received.   Patient states she is doing okay since recent hospital stay. States she has seen primary care provider for hospital follow up. No problems with transportation. States she has all of her medications and is taking as instructed by MD.   Voices she does not need [facility] case management services at this time.  Plan: Case closure.  Sherrin Daisy, RN BSN [place] Management Coordinator [facility] Care Management  [phone]

## 2017-12-16 DIAGNOSIS — N838 Other noninflammatory disorders of ovary, fallopian tube and broad ligament: Secondary | ICD-10-CM | POA: Insufficient documentation

## 2017-12-16 DIAGNOSIS — R971 Elevated cancer antigen 125 [CA 125]: Secondary | ICD-10-CM | POA: Insufficient documentation

## 2017-12-16 DIAGNOSIS — I10 Essential (primary) hypertension: Secondary | ICD-10-CM | POA: Diagnosis not present

## 2017-12-16 DIAGNOSIS — Z124 Encounter for screening for malignant neoplasm of cervix: Secondary | ICD-10-CM | POA: Diagnosis not present

## 2017-12-16 DIAGNOSIS — D509 Iron deficiency anemia, unspecified: Secondary | ICD-10-CM | POA: Insufficient documentation

## 2017-12-16 DIAGNOSIS — F17210 Nicotine dependence, cigarettes, uncomplicated: Secondary | ICD-10-CM | POA: Diagnosis not present

## 2017-12-16 DIAGNOSIS — Z1151 Encounter for screening for human papillomavirus (HPV): Secondary | ICD-10-CM | POA: Diagnosis not present

## 2017-12-16 DIAGNOSIS — D3912 Neoplasm of uncertain behavior of left ovary: Secondary | ICD-10-CM | POA: Insufficient documentation

## 2017-12-16 DIAGNOSIS — Z803 Family history of malignant neoplasm of breast: Secondary | ICD-10-CM | POA: Diagnosis not present

## 2017-12-16 DIAGNOSIS — N839 Noninflammatory disorder of ovary, fallopian tube and broad ligament, unspecified: Secondary | ICD-10-CM | POA: Diagnosis not present

## 2017-12-17 DIAGNOSIS — R971 Elevated cancer antigen 125 [CA 125]: Secondary | ICD-10-CM | POA: Diagnosis not present

## 2017-12-17 DIAGNOSIS — D509 Iron deficiency anemia, unspecified: Secondary | ICD-10-CM | POA: Diagnosis not present

## 2017-12-17 DIAGNOSIS — I10 Essential (primary) hypertension: Secondary | ICD-10-CM | POA: Diagnosis not present

## 2017-12-17 DIAGNOSIS — N839 Noninflammatory disorder of ovary, fallopian tube and broad ligament, unspecified: Secondary | ICD-10-CM | POA: Diagnosis not present

## 2017-12-23 DIAGNOSIS — N839 Noninflammatory disorder of ovary, fallopian tube and broad ligament, unspecified: Secondary | ICD-10-CM | POA: Diagnosis not present

## 2017-12-23 DIAGNOSIS — K7689 Other specified diseases of liver: Secondary | ICD-10-CM | POA: Diagnosis not present

## 2017-12-23 DIAGNOSIS — Z01419 Encounter for gynecological examination (general) (routine) without abnormal findings: Secondary | ICD-10-CM | POA: Diagnosis not present

## 2017-12-23 DIAGNOSIS — Z124 Encounter for screening for malignant neoplasm of cervix: Secondary | ICD-10-CM | POA: Diagnosis not present

## 2017-12-23 DIAGNOSIS — N83292 Other ovarian cyst, left side: Secondary | ICD-10-CM | POA: Diagnosis not present

## 2017-12-23 DIAGNOSIS — R87610 Atypical squamous cells of undetermined significance on cytologic smear of cervix (ASC-US): Secondary | ICD-10-CM | POA: Diagnosis not present

## 2017-12-23 DIAGNOSIS — K7460 Unspecified cirrhosis of liver: Secondary | ICD-10-CM | POA: Diagnosis not present

## 2017-12-23 DIAGNOSIS — D3912 Neoplasm of uncertain behavior of left ovary: Secondary | ICD-10-CM | POA: Diagnosis not present

## 2017-12-26 ENCOUNTER — Encounter: Payer: Self-pay | Admitting: Gastroenterology

## 2017-12-27 ENCOUNTER — Other Ambulatory Visit (INDEPENDENT_AMBULATORY_CARE_PROVIDER_SITE_OTHER): Payer: PPO

## 2017-12-27 ENCOUNTER — Ambulatory Visit (INDEPENDENT_AMBULATORY_CARE_PROVIDER_SITE_OTHER): Payer: PPO | Admitting: Gastroenterology

## 2017-12-27 ENCOUNTER — Encounter: Payer: Self-pay | Admitting: Gastroenterology

## 2017-12-27 VITALS — BP 128/68 | HR 97 | Ht 66.0 in | Wt 148.4 lb

## 2017-12-27 DIAGNOSIS — K7460 Unspecified cirrhosis of liver: Secondary | ICD-10-CM | POA: Diagnosis not present

## 2017-12-27 DIAGNOSIS — K7030 Alcoholic cirrhosis of liver without ascites: Secondary | ICD-10-CM

## 2017-12-27 DIAGNOSIS — R195 Other fecal abnormalities: Secondary | ICD-10-CM | POA: Diagnosis not present

## 2017-12-27 DIAGNOSIS — K7290 Hepatic failure, unspecified without coma: Secondary | ICD-10-CM | POA: Diagnosis not present

## 2017-12-27 LAB — AMMONIA: Ammonia: 63 umol/L — ABNORMAL HIGH (ref 11–35)

## 2017-12-27 MED ORDER — SUPREP BOWEL PREP KIT 17.5-3.13-1.6 GM/177ML PO SOLN: 1.0000 | ORAL | 0 refills | Status: DC

## 2017-12-27 MED ORDER — LACTULOSE 10 GM/15ML PO SOLN: 20.0000 g | Freq: Three times a day (TID) | ORAL | 11 refills | Status: AC

## 2017-12-27 NOTE — Progress Notes (Signed)
Chief Complaint:   Referring Provider:  Verdell Carmine., MD      ASSESSMENT AND PLAN;   #1. Heme positive stools with anemia Hb 9.6, MCV 98, nl plt #2. Liver Cirrhosis due to ETOH (on liver Bx 01/2014). Quit ETOH 01/2014, then restarted, quit again 06/2017.  Has associated portal hypertension. Neg acute viral hepatitis, autoimmune hepatitis panel, ANA, iron studies, ceruloplasmin, alpha-1 antitrypsin, celiac screen.  S/P vaccinations for A and B . Nl AFP 07/2014: 4.4. EGD 08/2014 portal hypertensive gastropathy, no varices, small hiatal hernia on nadolol (stopped recently). MELD score 11. Neg CTAP 12/2017 for HCC. #3. Hepatic encephalopathy (could not tolerate rifaximin due to rash) #4. History of colonic polyps (colon 08/2013). #5. Pelvic mass, normal CA125. (Followed by Dr Randel Books at [facility]).  Plan: - Check ammonia and AFP today. - Proceed with EGD and colonoscopy.  I have discussed the risks and benefits.  The risks including risk of perforation requiring laparotomy, bleeding after biopsies/polypectomy requiring blood transfusions and risks of anesthesia/sedation were discussed.  Rare risks of missing UGI and colorectal neoplasms were also discussed.  Alternatives were also given.  Patient is fully aware and agrees to proceed. All the questions were answered. Procedures will be scheduled in upcoming days.  Patient is to report immediately if there is any significant weight loss or excessive bleeding until then. Consent forms were given for review. - Lactulose 30cc po tid (titrate to 2-3 softer bowel movements per day)   HPI:    Chelsea Novak is a 62 y.o. female  With history of liver cirrhosis due to alcohol abuse Found to have ovarian/pelvic mass with heme positive stools. Seen by Dr. Lynelle Smoke at [facility] Advised to GI work-up prior to surgery. Patient admits that she has stopped drinking alcohol over the last 6 months.  History of heavy alcohol use in the past No melena or  hematochezia Denies having any significant abdominal pain No nausea/vomiting No nonsteroidals   Past Medical History:  Diagnosis Date  . Alcoholic cirrhosis ([place])   . Cerebral artery occlusion   . Cervical neuritis   . COPD (chronic obstructive pulmonary disease) ([place])   . Depression   . Diabetes ([place])   . GERD (gastroesophageal reflux disease)   . Hepatic encephalopathy ([place])   . HTN (hypertension)   . Hyperlipidemia   . Hypothyroidism   . Memory loss   . Osteopenia   . Vitamin D deficiency     Past Surgical History:  Procedure Laterality Date  . APPENDECTOMY  2011  . BACK SURGERY     5 back surgeries   . COLONOSCOPY  08/12/2013   Colonic polyps status post polypectomy. Mild sigmoid diverticulosis.   Marland Kitchen ESOPHAGOGASTRODUODENOSCOPY  08/26/2014   Portal hypertensive gastropathy. No defionite esophageal or fundal varices. Small hiatal hernia. Whitish lesions in esophagus.  Marland Kitchen gallbladder removal  2009    Family History  Problem Relation Age of Onset  . Breast cancer Mother   . Hypertension Mother   . Stroke Mother   . Hypertension Father   . Colon cancer Neg Hx   . Esophageal cancer Neg Hx     Social History   Tobacco Use  . Smoking status: Current Every Day Smoker    Types: Cigarettes  . Smokeless tobacco: Never Used  . Tobacco comment: about a pack a day  Substance Use Topics  . Alcohol use: Not Currently    Comment: quit alcohol March 2038m . Drug use: Never  Current Outpatient Medications  Medication Sig Dispense Refill  . albuterol (PROVENTIL HFA;VENTOLIN HFA) 108 (90 Base) MCG/ACT inhaler Inhale into the lungs.    . Blood Glucose Monitoring Suppl (FIFTY50 GLUCOSE METER 2.0) w/Device KIT Use as instructed    . Cholecalciferol (VITAMIN D3) 2000 units capsule TAKE 1 TABLET BY MOUTH DAILY.    . DULoxetine (CYMBALTA) 60 MG capsule Take 1 capsule by mouth daily.    . fluticasone furoate-vilanterol (BREO ELLIPTA) 100-25 MCG/INH AEPB Inhale into the lungs.     . gabapentin (NEURONTIN) 300 MG capsule Take 1 capsule by mouth daily.  2  . ipratropium-albuterol (DUONEB) 0.5-2.5 (3) MG/3ML SOLN Take 3 mLs by nebulization every 6 (six) hours as needed for Shortness of Breath.    . lactulose, encephalopathy, (CHRONULAC) 10 GM/15ML SOLN 10 mLs.  0  . levothyroxine (SYNTHROID, LEVOTHROID) 100 MCG tablet TAKE 1 TABLET (100 MCG TOTAL) BY MOUTH DAILY.  1  . pantoprazole (PROTONIX) 20 MG tablet Take 20 mg by mouth daily.    . valsartan (DIOVAN) 320 MG tablet Take 320 mg by mouth daily.  5  . glimepiride (AMARYL) 1 MG tablet Take 1 tablet by mouth daily.  3   No current facility-administered medications for this visit.     Allergies  Allergen Reactions  . Colesevelam     constipation  . Ezetimibe Diarrhea  . Morphine Itching    No reaction listed in emr   . Statins     No reaction listed in emr  . Varenicline     No reaction listed in emr    Review of Systems:  Constitutional: Denies fever, chills, diaphoresis, appetite change and has fatigue.  HEENT: Denies photophobia, eye pain, redness, hearing loss, ear pain, congestion, sore throat, rhinorrhea, sneezing, mouth sores, neck pain, neck stiffness and tinnitus.   Respiratory: Denies SOB, DOE, cough, chest tightness,  and wheezing.   Cardiovascular: Denies chest pain, palpitations and leg swelling.  Genitourinary: Denies dysuria, urgency, frequency, hematuria, flank pain and difficulty urinating.  Musculoskeletal: Has  myalgias, back pain, joint swelling, arthralgias and gait problem.  Skin: No rash.  Neurological: Denies dizziness, seizures, syncope, weakness, light-headedness, numbness and headaches.  Hematological: Denies adenopathy. Has Easy bruising. Psychiatric/Behavioral: Has anxiety or depression     Physical Exam:    BP 128/68   Pulse 97   Ht '5\' 6"'  (1.676 m)   Wt 148 lb 6 oz (67.3 kg)   BMI 23.95 kg/m  Filed Weights   12/27/17 0924  Weight: 148 lb 6 oz (67.3 kg)    Constitutional:  Well-developed, in no acute distress. Has pallor Psychiatric: Normal mood and affect. Behavior is normal. HEENT: Pupils normal.  Conjunctivae are normal. No scleral icterus. Neck supple.  Cardiovascular: Normal rate, regular rhythm. No edema Pulmonary/chest: Bilateral decreased breath sounds. No wheezing, rales or rhonchi. Abdominal: Soft, nondistended. Nontender. Bowel sounds active throughout. There are no masses palpable. Has hepatomegaly 7 cm below costal margin. Rectal:  defered Neurological: Alert and oriented to person place and time. Skin: Skin is warm and dry. No rashes noted.  Data Reviewed: I have personally reviewed following labs and imaging studies.  Labs from [facility] Labs/Studies reviewed: Results for Chelsea Novak, Chelsea Novak (MRN [id number]) as of 12/26/2017 11:20 Ref. Range 12/17/2017 10:02  WBC Latest Ref Range: 4.8 - 10.8 x 10*3/uL 6.3  Red Blood Count Latest Ref Range: 4.20 - 5.40 x 10*6/uL 2.90 (L)  HEMOGLOBIN Latest Ref Range: 12.0 - 16.0 G/DL 9.6 (L)  HEMATOCRIT  Latest Ref Range: 37.0 - 47.0 % 28.6 (L)  MCV Latest Ref Range: 81.0 - 99.0 FL 98.8  MCH Latest Ref Range: 27.0 - 31.0 PG 33.0 (H)  MCHC Latest Ref Range: 33.0 - 37.0 G/DL 33.4  RDW Latest Ref Range: 11.5 - 14.5 % 16.5 (H)  PLATELET COUNT Latest Ref Range: 160 - 360 X 10*3/uL 214  MPV Latest Ref Range: 6.8 - 10.2 FL 6.4 (L)  NEUTROPHILS RELATIVE PERCENT Latest Units: % 55  LYMPHOCYTES RELATIVE PERCENT Latest Units: % 36  MONOCYTES RELATIVE PERCENT Latest Units: % 7  BASOPHILS RELATIVE PERCENT Latest Units: % 1  EOSINOPHILS RELATIVE PERCENT Latest Units: % 2  NEUTROPHILS ABSOLUTE COUNT Latest Ref Range: 1.6 - 7.3 x 10*3/uL 3.5  LYMPHOCYTES ABSOLUTE COUNT Latest Ref Range: 1.0 - 5.1 x 10*3/uL 2.3  MONOCYTES ABSOLUTE COUNT Latest Ref Range: 0.1 - 0.9 x 10*3/uL 0.4  BASOPHILS ABSOLUTE COUNT Latest Ref Range: 0.0 - 0.2 x 10*3/uL 0.0  EOSINOPHILS ABSOLUTE COUNT Latest Ref Range: 0.0 - 0.5 x 10*3/uL  0.1  SODIUM Latest Ref Range: 135 - 146 MMOL/L 133 (L)  POTASSIUM Latest Ref Range: 3.5 - 5.3 MMOL/L 4.2  CHLORIDE Latest Ref Range: 98 - 110 MMOL/L 100  CO2 Latest Ref Range: 23 - 30 MMOL/L 27  BUN Latest Ref Range: 8 - 24 MG/DL 6 (L)  GLUCOSE Latest Ref Range: 70 - 99 MG/DL 92  CREATININE Latest Ref Range: 0.50 - 1.50 MG/DL 0.69  CALCIUM Latest Ref Range: 8.5 - 10.5 MG/DL 9.2  TOTAL PROTEIN Latest Ref Range: 6.0 - 8.3 G/DL 8.1  Albumin Latest Ref Range: 3.5 - 5.0 G/DL 3.3 (L)  BILIRUBIN TOTAL Latest Ref Range: 0.1 - 1.2 MG/DL 1.4 (H)  ALKALINE PHOSPHATASE Latest Ref Range: 25 - 125 IU/L or U/L 122  AST Latest Ref Range: 5 - 40 IU/L or U/L 49 (H)  ALT Latest Ref Range: 5 - 50 IU/L or U/L 24  ANION GAP Latest Ref Range: 4 - 14 MMOL/L 7  EST. GFR NON-BLACK Latest Ref Range: >=60 ML/MIN/1.73 M*2 >=90  EST. GFR BLACK Latest Ref Range: >=60 ML/MIN/1.73 M*2 >=90  Cancer Ag 125 (CA 125), S Latest Ref Range: <46 U/mL 21   Results for Chelsea Novak, Chelsea Novak (MRN [id number]) as of 12/26/2017 11:20 Ref. Range 12/23/2017 12:43  Prothrombin Time with INR Latest Ref Range: 11.6 - 15.2 SEC 16.1 (H)  INR Latest Ref Range: 0.00 - 1.49 1.27  PTT Latest Ref Range: 24.0 - 37.0 SEC 37.2 (H)  HEPATITIS A (IGG AND IGM) ANTIBODY Latest Ref Range: Non-Reactive Reactive (A)  HEPATITIS A IGM ANTIBODY Latest Ref Range: Non-Reactive Non-Reactive  HEPATITIS B SURFACE ANTIGEN Latest Ref Range: Non-Reactive Non-Reactive  HEPATITIS B CORE (IGG AND IGM) ANTIBODY Latest Ref Range: Non-Reactive Non-Reactive  HEPATITIS C ANTIBODY Latest Ref Range: Non-Reactive Non-Reactive  HEP-B SURFACE ANTIBODY Unknown REACTIVE (EQUIVAL...   CT A/P 12/23/17: (I reviewed the images myself) 1. Cystic LEFT ovarian mass not changed from recent MRI (11/13/2017). 2. Ill-defined thickening along the pericolic gutters at the interface of the peritoneal and retroperitoneal surfaces may relate to the LEFT cystic ovarian mass or potentially chronic  changes related to hepatic cirrhosis.. 3. No free-fluid in the abdomen pelvis. 4. Morphologic changes in liver consistent cirrhosis. Small low-density lesion in the RIGHT hepatic lobe is favored benign.     Carmell Austria, MD 12/27/2017, 9:51 AM  Cc: Verdell Carmine., MD

## 2017-12-27 NOTE — Patient Instructions (Signed)
If you are age 62 or older, your body mass index should be between 23-30. Your Body mass index is 23.95 kg/m. If this is out of the aforementioned range listed, please consider follow up with your Primary Care Provider.  If you are age 70 or younger, your body mass index should be between 19-25. Your Body mass index is 23.95 kg/m. If this is out of the aformentioned range listed, please consider follow up with your Primary Care Provider.    You have been scheduled for an endoscopy and colonoscopy. Please follow the written instructions given to you at your visit today. Please pick up your prep supplies at the pharmacy within the next 1-3 days. If you use inhalers (even only as needed), please bring them with you on the day of your procedure. Your physician has requested that you go to www.startemmi.com and enter the access code given to you at your visit today. This web site gives a general overview about your procedure. However, you should still follow specific instructions given to you by our office regarding your preparation for the procedure.   Please go to the lab on the 2nd floor [address] before you leave the office today.   We have sent the following medications to your pharmacy for you to pick up at your convenience: Lactulose 30 ml by mouth three times daily.   Thank you,  Dr. Jackquline Denmark

## 2017-12-30 ENCOUNTER — Other Ambulatory Visit: Payer: Self-pay

## 2017-12-30 DIAGNOSIS — K7460 Unspecified cirrhosis of liver: Secondary | ICD-10-CM

## 2017-12-30 LAB — AFP TUMOR MARKER: AFP TUMOR MARKER: 4.6 ng/mL

## 2018-01-14 DIAGNOSIS — I10 Essential (primary) hypertension: Secondary | ICD-10-CM | POA: Diagnosis not present

## 2018-01-14 DIAGNOSIS — N951 Menopausal and female climacteric states: Secondary | ICD-10-CM | POA: Diagnosis not present

## 2018-01-14 DIAGNOSIS — F339 Major depressive disorder, recurrent, unspecified: Secondary | ICD-10-CM | POA: Diagnosis not present

## 2018-01-14 DIAGNOSIS — K210 Gastro-esophageal reflux disease with esophagitis: Secondary | ICD-10-CM | POA: Diagnosis not present

## 2018-01-14 DIAGNOSIS — E1151 Type 2 diabetes mellitus with diabetic peripheral angiopathy without gangrene: Secondary | ICD-10-CM | POA: Diagnosis not present

## 2018-01-14 DIAGNOSIS — E538 Deficiency of other specified B group vitamins: Secondary | ICD-10-CM | POA: Diagnosis not present

## 2018-01-14 DIAGNOSIS — D509 Iron deficiency anemia, unspecified: Secondary | ICD-10-CM | POA: Diagnosis not present

## 2018-01-14 DIAGNOSIS — E559 Vitamin D deficiency, unspecified: Secondary | ICD-10-CM | POA: Diagnosis not present

## 2018-01-14 DIAGNOSIS — Z23 Encounter for immunization: Secondary | ICD-10-CM | POA: Diagnosis not present

## 2018-01-14 DIAGNOSIS — D500 Iron deficiency anemia secondary to blood loss (chronic): Secondary | ICD-10-CM | POA: Diagnosis not present

## 2018-01-14 DIAGNOSIS — R9431 Abnormal electrocardiogram [ECG] [EKG]: Secondary | ICD-10-CM | POA: Diagnosis not present

## 2018-01-14 DIAGNOSIS — E782 Mixed hyperlipidemia: Secondary | ICD-10-CM | POA: Diagnosis not present

## 2018-01-14 DIAGNOSIS — M8580 Other specified disorders of bone density and structure, unspecified site: Secondary | ICD-10-CM | POA: Diagnosis not present

## 2018-01-14 DIAGNOSIS — E871 Hypo-osmolality and hyponatremia: Secondary | ICD-10-CM | POA: Diagnosis not present

## 2018-01-14 DIAGNOSIS — F419 Anxiety disorder, unspecified: Secondary | ICD-10-CM | POA: Diagnosis not present

## 2018-01-14 DIAGNOSIS — I739 Peripheral vascular disease, unspecified: Secondary | ICD-10-CM | POA: Diagnosis not present

## 2018-01-14 DIAGNOSIS — J449 Chronic obstructive pulmonary disease, unspecified: Secondary | ICD-10-CM | POA: Diagnosis not present

## 2018-01-14 DIAGNOSIS — Z0000 Encounter for general adult medical examination without abnormal findings: Secondary | ICD-10-CM | POA: Diagnosis not present

## 2018-01-14 DIAGNOSIS — E039 Hypothyroidism, unspecified: Secondary | ICD-10-CM | POA: Diagnosis not present

## 2018-01-14 DIAGNOSIS — Z01818 Encounter for other preprocedural examination: Secondary | ICD-10-CM | POA: Diagnosis not present

## 2018-01-14 DIAGNOSIS — Z79899 Other long term (current) drug therapy: Secondary | ICD-10-CM | POA: Diagnosis not present

## 2018-01-23 ENCOUNTER — Encounter: Payer: Self-pay | Admitting: Gastroenterology

## 2018-01-27 ENCOUNTER — Other Ambulatory Visit (INDEPENDENT_AMBULATORY_CARE_PROVIDER_SITE_OTHER): Payer: PPO

## 2018-01-27 DIAGNOSIS — K7460 Unspecified cirrhosis of liver: Secondary | ICD-10-CM

## 2018-01-27 LAB — AMMONIA: AMMONIA: 80 umol/L — AB (ref 11–35)

## 2018-02-04 ENCOUNTER — Encounter: Payer: Self-pay | Admitting: *Deleted

## 2018-02-06 ENCOUNTER — Encounter: Payer: Self-pay | Admitting: Gastroenterology

## 2018-02-06 ENCOUNTER — Ambulatory Visit (AMBULATORY_SURGERY_CENTER): Payer: PPO | Admitting: Gastroenterology

## 2018-02-06 VITALS — BP 124/70 | HR 94 | Temp 98.2°F | Resp 18 | Ht 66.0 in | Wt 148.0 lb

## 2018-02-06 DIAGNOSIS — K5730 Diverticulosis of large intestine without perforation or abscess without bleeding: Secondary | ICD-10-CM

## 2018-02-06 DIAGNOSIS — K449 Diaphragmatic hernia without obstruction or gangrene: Secondary | ICD-10-CM | POA: Diagnosis not present

## 2018-02-06 DIAGNOSIS — D123 Benign neoplasm of transverse colon: Secondary | ICD-10-CM

## 2018-02-06 DIAGNOSIS — K3189 Other diseases of stomach and duodenum: Secondary | ICD-10-CM | POA: Diagnosis not present

## 2018-02-06 DIAGNOSIS — D122 Benign neoplasm of ascending colon: Secondary | ICD-10-CM | POA: Diagnosis not present

## 2018-02-06 DIAGNOSIS — K766 Portal hypertension: Secondary | ICD-10-CM | POA: Diagnosis not present

## 2018-02-06 DIAGNOSIS — Z1211 Encounter for screening for malignant neoplasm of colon: Secondary | ICD-10-CM | POA: Diagnosis not present

## 2018-02-06 DIAGNOSIS — K7460 Unspecified cirrhosis of liver: Secondary | ICD-10-CM

## 2018-02-06 DIAGNOSIS — K648 Other hemorrhoids: Secondary | ICD-10-CM

## 2018-02-06 DIAGNOSIS — I8500 Esophageal varices without bleeding: Secondary | ICD-10-CM | POA: Diagnosis not present

## 2018-02-06 DIAGNOSIS — D124 Benign neoplasm of descending colon: Secondary | ICD-10-CM | POA: Diagnosis not present

## 2018-02-06 DIAGNOSIS — R195 Other fecal abnormalities: Secondary | ICD-10-CM | POA: Diagnosis not present

## 2018-02-06 MED ORDER — SODIUM CHLORIDE 0.9 % IV SOLN: 500.0000 mL | Freq: Once | INTRAVENOUS | Status: DC

## 2018-02-06 MED ORDER — CARVEDILOL 3.125 MG PO TABS: 3.1250 mg | ORAL_TABLET | Freq: Every day | ORAL | 11 refills | Status: AC

## 2018-02-06 NOTE — Progress Notes (Signed)
To PACU, VSS. Report to [email or web address] 

## 2018-02-06 NOTE — Progress Notes (Signed)
Called to room to assist during endoscopic procedure.  Patient ID and intended procedure confirmed with present staff. Received instructions for my participation in the procedure from the performing physician.  

## 2018-02-06 NOTE — Op Note (Signed)
[place] Patient Name: Chelsea Novak Procedure Date: 02/06/2018 2:36 PM MRN: [id number] Endoscopist: Jackquline Denmark , MD Age: 62 Referring MD:  Date of Birth: 06/13/1955 Gender: Female Account #: [id number] Procedure:                Upper GI endoscopy Indications:              #1. Heme positive stools with anemia Hb 9.6, MCV                            98, nl plt                           #2. Liver Cirrhosis due to ETOH (on liver Bx                            01/2014). Quit ETOH 01/2014, then restarted, quit                            again 06/2017. Has associated portal hypertension.                            Neg acute viral hepatitis, autoimmune hepatitis                            panel, ANA, iron studies, ceruloplasmin, alpha-1                            antitrypsin, celiac screen. S/P vaccinations for A                            and B . Nl AFP 07/2014: 4.4. EGD 08/2014 portal                            hypertensive gastropathy, no varices, small hiatal                            hernia on nadolol (stopped recently). MELD score                            11. Neg CTAP 12/2017 for HCC. Medicines:                Monitored Anesthesia Care Procedure:                Pre-Anesthesia Assessment:                           - Prior to the procedure, a History and Physical                            was performed, and patient medications and                            allergies were reviewed. The patient's tolerance of  previous anesthesia was also reviewed. The risks                            and benefits of the procedure and the sedation                            options and risks were discussed with the patient.                            All questions were answered, and informed consent                            was obtained. Prior Anticoagulants: The patient has                            taken no previous anticoagulant or antiplatelet            agents. ASA Grade Assessment: III - A patient with                            severe systemic disease. After reviewing the risks                            and benefits, the patient was deemed in                            satisfactory condition to undergo the procedure.                           After obtaining informed consent, the endoscope was                            passed under direct vision. Throughout the                            procedure, the patient's blood pressure, pulse, and                            oxygen saturations were monitored continuously. The                            Model GIF-[id number]) scope was introduced                            through the mouth, and advanced to the second part                            of duodenum. The upper GI endoscopy was                            accomplished without difficulty. The patient                            tolerated the procedure well. Scope In:  Scope Out: Findings:                 3 channels of grade I varices were found in the                            lower third of the esophagus, completely                            obliterated on distention of the esophagus. They                            were 3 to 4 mm in largest diameter. Estimated blood                            loss: none. Too small for EVL                           A small hiatal hernia was present.                           Moderate portal hypertensive gastropathy was found                            in the gastric fundus and the body of the stomach.                            No fundal varices. Biopsies were taken with a cold                            forceps for Helicobacter pylori testing. Estimated                            blood loss: none.                           The examined duodenum was normal. Estimated blood                            loss: none. Complications:            No immediate complications. Estimated Blood Loss:      Estimated blood loss: none. Impression:               - Grade I esophageal varices.                           - Small hiatal hernia.                           - Portal hypertensive gastropathy. Recommendation:           - Patient has a contact number available for                            emergencies. The signs and symptoms of potential  delayed complications were discussed with the                            patient. Return to normal activities tomorrow.                            Written discharge instructions were provided to the                            patient.                           - Resume previous low salt diet.                           - Continue present medications. Continue Protonix                            40 mg p.o. once a day.                           - Pt awaiting pelvic Sx. Ideally would recommend                            starting Coreg 3.125mg  po qd preoperatively. (Pt                            has borderline low blood pressure)                           - No aspirin, ibuprofen, naproxen, or other                            non-steroidal anti-inflammatory drugs.                           - Await pathology results.                           - Repeat upper endoscopy in 1 year.                           - Return to GI clinic in 12 weeks. Jackquline Denmark, MD 02/06/2018 3:47:34 PM This report has been signed electronically.

## 2018-02-06 NOTE — Op Note (Signed)
[place] Patient Name: Chelsea Novak Procedure Date: 02/06/2018 2:36 PM MRN: [id number] Endoscopist: Jackquline Denmark , MD Age: 62 Referring MD:  Date of Birth: 1956/02/10 Gender: Female Account #: [id number] Procedure:                Colonoscopy Indications:              Heme positive stool Medicines:                Monitored Anesthesia Care Procedure:                Pre-Anesthesia Assessment:                           - Prior to the procedure, a History and Physical                            was performed, and patient medications and                            allergies were reviewed. The patient's tolerance of                            previous anesthesia was also reviewed. The risks                            and benefits of the procedure and the sedation                            options and risks were discussed with the patient.                            All questions were answered, and informed consent                            was obtained. Prior Anticoagulants: The patient has                            taken no previous anticoagulant or antiplatelet                            agents. ASA Grade Assessment: III - A patient with                            severe systemic disease. After reviewing the risks                            and benefits, the patient was deemed in                            satisfactory condition to undergo the procedure.                           After obtaining informed consent, the colonoscope  was passed under direct vision. Throughout the                            procedure, the patient's blood pressure, pulse, and                            oxygen saturations were monitored continuously. The                            Model PCF-[id number]) scope was introduced                            through the anus and advanced to the 2 cm into the                            ileum. The colonoscopy was performed  without                            difficulty. The patient tolerated the procedure                            well. The quality of the bowel preparation was good. Scope In: 3:10:22 PM Scope Out: 3:28:10 PM Scope Withdrawal Time: 0 hours 12 minutes 20 seconds  Total Procedure Duration: 0 hours 17 minutes 48 seconds  Findings:                 Three sessile polyps were found in the proximal                            transverse colon and ascending colon. The polyps                            were 4 to 6 mm in size. The smaller polyps were                            removed with a cold biopsy forceps and the larger                            with cold snare polypectomy. Resection and                            retrieval were complete. Estimated blood loss: none.                           Two sessile polyps were found in the proximal                            descending colon and mid descending colon. The                            polyps were 6 to 8 mm in size. These polyps were  removed with a cold snare. Resection and retrieval                            were complete. Estimated blood loss: none.                           A few small-mouthed diverticula were found in the                            sigmoid colon.                           Non-bleeding internal hemorrhoids were found during                            retroflexion. The hemorrhoids were moderate.                           The exam was otherwise without abnormality on                            direct and retroflexion views. Complications:            No immediate complications. Estimated Blood Loss:     Estimated blood loss: none. Impression:               -Colonic polyps status post polypectomy.                           -Mild sigmoid diverticulosis                           -Non-bleeding internal hemorrhoids.                           -Otherwise normal colonoscopy to TI. Recommendation:           -  Patient has a contact number available for                            emergencies. The signs and symptoms of potential                            delayed complications were discussed with the                            patient. Return to normal activities tomorrow.                            Written discharge instructions were provided to the                            patient.                           - Resume previous diet.                           -  Continue present medications.                           - Await pathology results.                           - Repeat colonoscopy for surveillance based on                            pathology results.                           - Return to GI clinic PRN.                           - Can proceed with pelvic surgery. Jackquline Denmark, MD 02/06/2018 3:37:23 PM This report has been signed electronically.

## 2018-02-06 NOTE — Patient Instructions (Signed)
Handouts given on polyps, diverticulosis and hemorrhoids and gastritis and GERD protocol. May proceed with pelvic surgery. No NSAIDS. Repeat upper endoscopy in one year. Return to gi clinic in 12 weeks---RN from 3rd floor should call you tomorrow to schedule and if not you call her at [facility]:   Refer to the procedure report that was given to you for any specific questions about what was found during the examination.  If the procedure report does not answer your questions, please call your gastroenterologist to clarify.  If you requested that your care partner not be given the details of your procedure findings, then the procedure report has been included in a sealed envelope for you to review at your convenience later.  YOU SHOULD EXPECT: Some feelings of bloating in the abdomen. Passage of more gas than usual.  Walking can help get rid of the air that was put into your GI tract during the procedure and reduce the bloating. If you had a lower endoscopy (such as a colonoscopy or flexible sigmoidoscopy) you may notice spotting of blood in your stool or on the toilet paper. If you underwent a bowel prep for your procedure, you may not have a normal bowel movement for a few days.  Please Note:  You might notice some irritation and congestion in your nose or some drainage.  This is from the oxygen used during your procedure.  There is no need for concern and it should clear up in a day or so.  SYMPTOMS TO REPORT IMMEDIATELY:   Following lower endoscopy (colonoscopy or flexible sigmoidoscopy):  Excessive amounts of blood in the stool  Significant tenderness or worsening of abdominal pains  Swelling of the abdomen that is new, acute  Fever of 100F or higher   Following upper endoscopy (EGD)  Vomiting of blood or coffee ground material  New chest pain or pain under the shoulder blades  Painful or persistently  difficult swallowing  New shortness of breath  Fever of 100F or higher  Black, tarry-looking stools  For urgent or emergent issues, a gastroenterologist can be reached at any hour by calling [phone].   DIET:  We do recommend a small meal at first, but then you may proceed to your regular diet.  Drink plenty of fluids but you should avoid alcoholic beverages for 24 hours.  ACTIVITY:  You should plan to take it easy for the rest of today and you should NOT DRIVE or use heavy machinery until tomorrow (because of the sedation medicines used during the test).    FOLLOW UP: Our staff will call the number listed on your records the next business day following your procedure to check on you and address any questions or concerns that you may have regarding the information given to you following your procedure. If we do not reach you, we will leave a message.  However, if you are feeling well and you are not experiencing any problems, there is no need to return our call.  We will assume that you have returned to your regular daily activities without incident.  If any biopsies were taken you will be contacted by phone or by letter within the next 1-3 weeks.  Please call us at [phone] if you have not heard about the biopsies in 3 weeks.    SIGNATURES/CONFIDENTIALITY: You and/or your care partner have signed paperwork which will be entered into your  electronic medical record.  These signatures attest to the fact that that the information above on your After Visit Summary has been reviewed and is understood.  Full responsibility of the confidentiality of this discharge information lies with you and/or your care-partner.

## 2018-02-07 ENCOUNTER — Telehealth: Payer: Self-pay | Admitting: *Deleted

## 2018-02-07 NOTE — Telephone Encounter (Signed)
  Follow up Call-  Call back number 02/06/2018  Post procedure Call Back phone  # [phone] cell  Permission to leave phone message Yes  Some recent data might be hidden     Patient questions:  Do you have a fever, pain , or abdominal swelling? No. Pain Score  0 *  Have you tolerated food without any problems? Yes.    Have you been able to return to your normal activities? Yes.    Do you have any questions about your discharge instructions: Diet   No. Medications  No. Follow up visit  No.  Do you have questions or concerns about your Care? No.  Actions: * If pain score is 4 or above: No action needed, pain <4.

## 2018-02-10 DIAGNOSIS — K7030 Alcoholic cirrhosis of liver without ascites: Secondary | ICD-10-CM | POA: Diagnosis not present

## 2018-02-10 DIAGNOSIS — F17210 Nicotine dependence, cigarettes, uncomplicated: Secondary | ICD-10-CM | POA: Diagnosis not present

## 2018-02-10 DIAGNOSIS — N838 Other noninflammatory disorders of ovary, fallopian tube and broad ligament: Secondary | ICD-10-CM | POA: Diagnosis not present

## 2018-02-10 DIAGNOSIS — Z720 Tobacco use: Secondary | ICD-10-CM | POA: Diagnosis not present

## 2018-02-10 DIAGNOSIS — R1900 Intra-abdominal and pelvic swelling, mass and lump, unspecified site: Secondary | ICD-10-CM | POA: Diagnosis not present

## 2018-02-10 DIAGNOSIS — N83202 Unspecified ovarian cyst, left side: Secondary | ICD-10-CM | POA: Diagnosis not present

## 2018-02-12 ENCOUNTER — Encounter: Payer: Self-pay | Admitting: Gastroenterology

## 2018-02-25 DIAGNOSIS — N8500 Endometrial hyperplasia, unspecified: Secondary | ICD-10-CM | POA: Diagnosis not present

## 2018-02-25 DIAGNOSIS — N888 Other specified noninflammatory disorders of cervix uteri: Secondary | ICD-10-CM | POA: Diagnosis not present

## 2018-02-25 DIAGNOSIS — R87810 Cervical high risk human papillomavirus (HPV) DNA test positive: Secondary | ICD-10-CM | POA: Diagnosis not present

## 2018-02-26 DIAGNOSIS — I350 Nonrheumatic aortic (valve) stenosis: Secondary | ICD-10-CM | POA: Diagnosis not present

## 2018-02-26 DIAGNOSIS — E119 Type 2 diabetes mellitus without complications: Secondary | ICD-10-CM | POA: Diagnosis not present

## 2018-02-26 DIAGNOSIS — F329 Major depressive disorder, single episode, unspecified: Secondary | ICD-10-CM | POA: Diagnosis not present

## 2018-02-26 DIAGNOSIS — I10 Essential (primary) hypertension: Secondary | ICD-10-CM | POA: Diagnosis not present

## 2018-02-26 DIAGNOSIS — I6529 Occlusion and stenosis of unspecified carotid artery: Secondary | ICD-10-CM | POA: Diagnosis not present

## 2018-02-26 DIAGNOSIS — N83202 Unspecified ovarian cyst, left side: Secondary | ICD-10-CM | POA: Diagnosis not present

## 2018-02-26 DIAGNOSIS — J449 Chronic obstructive pulmonary disease, unspecified: Secondary | ICD-10-CM | POA: Diagnosis not present

## 2018-02-26 DIAGNOSIS — F17210 Nicotine dependence, cigarettes, uncomplicated: Secondary | ICD-10-CM | POA: Diagnosis not present

## 2018-02-26 DIAGNOSIS — I361 Nonrheumatic tricuspid (valve) insufficiency: Secondary | ICD-10-CM | POA: Diagnosis not present

## 2018-02-26 DIAGNOSIS — D649 Anemia, unspecified: Secondary | ICD-10-CM | POA: Diagnosis not present

## 2018-02-26 DIAGNOSIS — I358 Other nonrheumatic aortic valve disorders: Secondary | ICD-10-CM | POA: Diagnosis not present

## 2018-02-26 DIAGNOSIS — K7030 Alcoholic cirrhosis of liver without ascites: Secondary | ICD-10-CM | POA: Diagnosis not present

## 2018-02-26 DIAGNOSIS — F419 Anxiety disorder, unspecified: Secondary | ICD-10-CM | POA: Diagnosis not present

## 2018-02-26 DIAGNOSIS — R0609 Other forms of dyspnea: Secondary | ICD-10-CM | POA: Diagnosis not present

## 2018-02-26 DIAGNOSIS — M5416 Radiculopathy, lumbar region: Secondary | ICD-10-CM | POA: Diagnosis not present

## 2018-02-26 DIAGNOSIS — E039 Hypothyroidism, unspecified: Secondary | ICD-10-CM | POA: Diagnosis not present

## 2018-02-26 DIAGNOSIS — I340 Nonrheumatic mitral (valve) insufficiency: Secondary | ICD-10-CM | POA: Diagnosis not present

## 2018-03-11 DIAGNOSIS — D3912 Neoplasm of uncertain behavior of left ovary: Secondary | ICD-10-CM | POA: Diagnosis not present

## 2018-03-11 DIAGNOSIS — N8302 Follicular cyst of left ovary: Secondary | ICD-10-CM | POA: Diagnosis not present

## 2018-03-11 DIAGNOSIS — N838 Other noninflammatory disorders of ovary, fallopian tube and broad ligament: Secondary | ICD-10-CM | POA: Diagnosis not present

## 2018-03-11 DIAGNOSIS — N83292 Other ovarian cyst, left side: Secondary | ICD-10-CM | POA: Diagnosis not present

## 2018-03-24 DIAGNOSIS — Z90722 Acquired absence of ovaries, bilateral: Secondary | ICD-10-CM | POA: Diagnosis not present

## 2018-03-24 DIAGNOSIS — D271 Benign neoplasm of left ovary: Secondary | ICD-10-CM | POA: Diagnosis not present

## 2018-03-24 DIAGNOSIS — Z9079 Acquired absence of other genital organ(s): Secondary | ICD-10-CM | POA: Diagnosis not present

## 2018-03-24 DIAGNOSIS — Z48816 Encounter for surgical aftercare following surgery on the genitourinary system: Secondary | ICD-10-CM | POA: Diagnosis not present

## 2018-03-28 DIAGNOSIS — H5203 Hypermetropia, bilateral: Secondary | ICD-10-CM | POA: Diagnosis not present

## 2018-03-28 DIAGNOSIS — E119 Type 2 diabetes mellitus without complications: Secondary | ICD-10-CM | POA: Diagnosis not present

## 2018-03-28 DIAGNOSIS — H04123 Dry eye syndrome of bilateral lacrimal glands: Secondary | ICD-10-CM | POA: Diagnosis not present

## 2018-03-28 DIAGNOSIS — H40023 Open angle with borderline findings, high risk, bilateral: Secondary | ICD-10-CM | POA: Diagnosis not present

## 2018-03-28 DIAGNOSIS — H25013 Cortical age-related cataract, bilateral: Secondary | ICD-10-CM | POA: Diagnosis not present

## 2018-03-28 DIAGNOSIS — H52223 Regular astigmatism, bilateral: Secondary | ICD-10-CM | POA: Diagnosis not present

## 2018-03-28 DIAGNOSIS — H16213 Exposure keratoconjunctivitis, bilateral: Secondary | ICD-10-CM | POA: Diagnosis not present

## 2018-03-28 DIAGNOSIS — H524 Presbyopia: Secondary | ICD-10-CM | POA: Diagnosis not present

## 2018-03-28 DIAGNOSIS — H2513 Age-related nuclear cataract, bilateral: Secondary | ICD-10-CM | POA: Diagnosis not present

## 2018-04-25 DIAGNOSIS — K7460 Unspecified cirrhosis of liver: Secondary | ICD-10-CM | POA: Diagnosis not present

## 2018-04-25 DIAGNOSIS — D649 Anemia, unspecified: Secondary | ICD-10-CM | POA: Diagnosis not present

## 2018-04-25 DIAGNOSIS — I10 Essential (primary) hypertension: Secondary | ICD-10-CM | POA: Diagnosis not present

## 2018-04-25 DIAGNOSIS — E1151 Type 2 diabetes mellitus with diabetic peripheral angiopathy without gangrene: Secondary | ICD-10-CM | POA: Diagnosis not present

## 2018-04-25 DIAGNOSIS — J410 Simple chronic bronchitis: Secondary | ICD-10-CM | POA: Diagnosis not present

## 2018-04-25 DIAGNOSIS — G4700 Insomnia, unspecified: Secondary | ICD-10-CM | POA: Diagnosis not present

## 2018-07-17 DIAGNOSIS — K439 Ventral hernia without obstruction or gangrene: Secondary | ICD-10-CM | POA: Diagnosis not present

## 2018-07-24 DIAGNOSIS — K439 Ventral hernia without obstruction or gangrene: Secondary | ICD-10-CM | POA: Diagnosis not present

## 2018-07-24 DIAGNOSIS — D649 Anemia, unspecified: Secondary | ICD-10-CM | POA: Diagnosis not present

## 2018-07-24 DIAGNOSIS — F17210 Nicotine dependence, cigarettes, uncomplicated: Secondary | ICD-10-CM | POA: Diagnosis not present

## 2018-09-04 DIAGNOSIS — F17200 Nicotine dependence, unspecified, uncomplicated: Secondary | ICD-10-CM | POA: Diagnosis not present

## 2018-09-04 DIAGNOSIS — K439 Ventral hernia without obstruction or gangrene: Secondary | ICD-10-CM | POA: Diagnosis not present

## 2018-09-04 DIAGNOSIS — K436 Other and unspecified ventral hernia with obstruction, without gangrene: Secondary | ICD-10-CM | POA: Diagnosis not present

## 2018-09-04 DIAGNOSIS — Z01818 Encounter for other preprocedural examination: Secondary | ICD-10-CM | POA: Diagnosis not present

## 2018-09-09 DIAGNOSIS — K7460 Unspecified cirrhosis of liver: Secondary | ICD-10-CM | POA: Diagnosis not present

## 2018-09-09 DIAGNOSIS — E871 Hypo-osmolality and hyponatremia: Secondary | ICD-10-CM | POA: Diagnosis not present

## 2018-09-09 DIAGNOSIS — F339 Major depressive disorder, recurrent, unspecified: Secondary | ICD-10-CM | POA: Diagnosis not present

## 2018-09-09 DIAGNOSIS — E1151 Type 2 diabetes mellitus with diabetic peripheral angiopathy without gangrene: Secondary | ICD-10-CM | POA: Diagnosis not present

## 2018-09-09 DIAGNOSIS — E039 Hypothyroidism, unspecified: Secondary | ICD-10-CM | POA: Diagnosis not present

## 2018-09-09 DIAGNOSIS — J410 Simple chronic bronchitis: Secondary | ICD-10-CM | POA: Diagnosis not present

## 2018-09-15 DIAGNOSIS — F17200 Nicotine dependence, unspecified, uncomplicated: Secondary | ICD-10-CM | POA: Diagnosis not present

## 2018-09-15 DIAGNOSIS — K439 Ventral hernia without obstruction or gangrene: Secondary | ICD-10-CM | POA: Diagnosis not present

## 2018-09-15 DIAGNOSIS — Z01818 Encounter for other preprocedural examination: Secondary | ICD-10-CM | POA: Diagnosis not present

## 2018-09-15 DIAGNOSIS — I700 Atherosclerosis of aorta: Secondary | ICD-10-CM | POA: Diagnosis not present

## 2018-09-15 DIAGNOSIS — K5790 Diverticulosis of intestine, part unspecified, without perforation or abscess without bleeding: Secondary | ICD-10-CM | POA: Diagnosis not present

## 2018-09-15 DIAGNOSIS — M87851 Other osteonecrosis, right femur: Secondary | ICD-10-CM | POA: Diagnosis not present

## 2018-09-15 DIAGNOSIS — M87852 Other osteonecrosis, left femur: Secondary | ICD-10-CM | POA: Diagnosis not present

## 2018-09-15 DIAGNOSIS — K5730 Diverticulosis of large intestine without perforation or abscess without bleeding: Secondary | ICD-10-CM | POA: Diagnosis not present

## 2018-09-16 DIAGNOSIS — K439 Ventral hernia without obstruction or gangrene: Secondary | ICD-10-CM | POA: Diagnosis not present

## 2018-09-22 DIAGNOSIS — E871 Hypo-osmolality and hyponatremia: Secondary | ICD-10-CM | POA: Diagnosis not present

## 2018-09-24 DIAGNOSIS — H25013 Cortical age-related cataract, bilateral: Secondary | ICD-10-CM | POA: Diagnosis not present

## 2018-09-24 DIAGNOSIS — H2513 Age-related nuclear cataract, bilateral: Secondary | ICD-10-CM | POA: Diagnosis not present

## 2018-09-24 DIAGNOSIS — H40233 Intermittent angle-closure glaucoma, bilateral: Secondary | ICD-10-CM | POA: Diagnosis not present

## 2018-09-25 DIAGNOSIS — E871 Hypo-osmolality and hyponatremia: Secondary | ICD-10-CM | POA: Diagnosis not present

## 2018-09-29 DIAGNOSIS — Z01812 Encounter for preprocedural laboratory examination: Secondary | ICD-10-CM | POA: Diagnosis not present

## 2018-09-29 DIAGNOSIS — K439 Ventral hernia without obstruction or gangrene: Secondary | ICD-10-CM | POA: Diagnosis not present

## 2018-09-29 DIAGNOSIS — Z1159 Encounter for screening for other viral diseases: Secondary | ICD-10-CM | POA: Diagnosis not present

## 2018-10-03 DIAGNOSIS — J449 Chronic obstructive pulmonary disease, unspecified: Secondary | ICD-10-CM | POA: Diagnosis not present

## 2018-10-03 DIAGNOSIS — F17210 Nicotine dependence, cigarettes, uncomplicated: Secondary | ICD-10-CM | POA: Diagnosis not present

## 2018-10-03 DIAGNOSIS — R011 Cardiac murmur, unspecified: Secondary | ICD-10-CM | POA: Diagnosis not present

## 2018-10-03 DIAGNOSIS — F1021 Alcohol dependence, in remission: Secondary | ICD-10-CM | POA: Diagnosis not present

## 2018-10-03 DIAGNOSIS — E871 Hypo-osmolality and hyponatremia: Secondary | ICD-10-CM | POA: Diagnosis not present

## 2018-10-03 DIAGNOSIS — Z6828 Body mass index (BMI) 28.0-28.9, adult: Secondary | ICD-10-CM | POA: Diagnosis not present

## 2018-10-03 DIAGNOSIS — K436 Other and unspecified ventral hernia with obstruction, without gangrene: Secondary | ICD-10-CM | POA: Diagnosis not present

## 2018-10-03 DIAGNOSIS — I10 Essential (primary) hypertension: Secondary | ICD-10-CM | POA: Diagnosis not present

## 2018-10-03 DIAGNOSIS — E669 Obesity, unspecified: Secondary | ICD-10-CM | POA: Diagnosis not present

## 2018-10-03 DIAGNOSIS — Z79899 Other long term (current) drug therapy: Secondary | ICD-10-CM | POA: Diagnosis not present

## 2018-10-03 DIAGNOSIS — K7030 Alcoholic cirrhosis of liver without ascites: Secondary | ICD-10-CM | POA: Diagnosis not present

## 2018-11-07 DIAGNOSIS — H40233 Intermittent angle-closure glaucoma, bilateral: Secondary | ICD-10-CM | POA: Diagnosis not present

## 2018-11-07 DIAGNOSIS — H2513 Age-related nuclear cataract, bilateral: Secondary | ICD-10-CM | POA: Diagnosis not present

## 2018-11-07 DIAGNOSIS — H25013 Cortical age-related cataract, bilateral: Secondary | ICD-10-CM | POA: Diagnosis not present

## 2018-11-07 DIAGNOSIS — H5371 Glare sensitivity: Secondary | ICD-10-CM | POA: Diagnosis not present

## 2018-11-07 DIAGNOSIS — E119 Type 2 diabetes mellitus without complications: Secondary | ICD-10-CM | POA: Diagnosis not present

## 2018-11-07 DIAGNOSIS — H2511 Age-related nuclear cataract, right eye: Secondary | ICD-10-CM | POA: Diagnosis not present

## 2018-11-07 DIAGNOSIS — H25011 Cortical age-related cataract, right eye: Secondary | ICD-10-CM | POA: Diagnosis not present

## 2018-11-18 DIAGNOSIS — Z20828 Contact with and (suspected) exposure to other viral communicable diseases: Secondary | ICD-10-CM | POA: Diagnosis not present

## 2018-11-18 DIAGNOSIS — Z1159 Encounter for screening for other viral diseases: Secondary | ICD-10-CM | POA: Diagnosis not present

## 2018-11-18 DIAGNOSIS — Z01812 Encounter for preprocedural laboratory examination: Secondary | ICD-10-CM | POA: Diagnosis not present

## 2018-11-18 DIAGNOSIS — H2511 Age-related nuclear cataract, right eye: Secondary | ICD-10-CM | POA: Diagnosis not present

## 2018-11-18 DIAGNOSIS — H25011 Cortical age-related cataract, right eye: Secondary | ICD-10-CM | POA: Diagnosis not present

## 2018-11-18 DIAGNOSIS — H2513 Age-related nuclear cataract, bilateral: Secondary | ICD-10-CM | POA: Diagnosis not present

## 2018-11-18 DIAGNOSIS — H25013 Cortical age-related cataract, bilateral: Secondary | ICD-10-CM | POA: Diagnosis not present

## 2018-11-24 DIAGNOSIS — H25811 Combined forms of age-related cataract, right eye: Secondary | ICD-10-CM | POA: Diagnosis not present

## 2018-11-24 DIAGNOSIS — H2511 Age-related nuclear cataract, right eye: Secondary | ICD-10-CM | POA: Diagnosis not present

## 2018-11-24 DIAGNOSIS — I10 Essential (primary) hypertension: Secondary | ICD-10-CM | POA: Diagnosis not present

## 2018-11-24 DIAGNOSIS — F17210 Nicotine dependence, cigarettes, uncomplicated: Secondary | ICD-10-CM | POA: Diagnosis not present

## 2018-11-24 DIAGNOSIS — H5703 Miosis: Secondary | ICD-10-CM | POA: Diagnosis not present

## 2018-11-24 DIAGNOSIS — H25011 Cortical age-related cataract, right eye: Secondary | ICD-10-CM | POA: Diagnosis not present

## 2018-12-02 DIAGNOSIS — Z01812 Encounter for preprocedural laboratory examination: Secondary | ICD-10-CM | POA: Diagnosis not present

## 2018-12-02 DIAGNOSIS — H2512 Age-related nuclear cataract, left eye: Secondary | ICD-10-CM | POA: Diagnosis not present

## 2018-12-02 DIAGNOSIS — H5703 Miosis: Secondary | ICD-10-CM | POA: Diagnosis not present

## 2018-12-02 DIAGNOSIS — Z20828 Contact with and (suspected) exposure to other viral communicable diseases: Secondary | ICD-10-CM | POA: Diagnosis not present

## 2018-12-02 DIAGNOSIS — H25012 Cortical age-related cataract, left eye: Secondary | ICD-10-CM | POA: Diagnosis not present

## 2018-12-08 DIAGNOSIS — H25812 Combined forms of age-related cataract, left eye: Secondary | ICD-10-CM | POA: Diagnosis not present

## 2018-12-08 DIAGNOSIS — H2512 Age-related nuclear cataract, left eye: Secondary | ICD-10-CM | POA: Diagnosis not present

## 2018-12-08 DIAGNOSIS — H5703 Miosis: Secondary | ICD-10-CM | POA: Diagnosis not present

## 2018-12-08 DIAGNOSIS — H25012 Cortical age-related cataract, left eye: Secondary | ICD-10-CM | POA: Diagnosis not present

## 2018-12-30 DIAGNOSIS — F17200 Nicotine dependence, unspecified, uncomplicated: Secondary | ICD-10-CM | POA: Diagnosis not present

## 2018-12-30 DIAGNOSIS — D491 Neoplasm of unspecified behavior of respiratory system: Secondary | ICD-10-CM | POA: Diagnosis not present

## 2018-12-30 DIAGNOSIS — J342 Deviated nasal septum: Secondary | ICD-10-CM | POA: Diagnosis not present

## 2018-12-30 DIAGNOSIS — J040 Acute laryngitis: Secondary | ICD-10-CM | POA: Diagnosis not present

## 2018-12-30 DIAGNOSIS — R490 Dysphonia: Secondary | ICD-10-CM | POA: Diagnosis not present

## 2019-01-02 DIAGNOSIS — E1165 Type 2 diabetes mellitus with hyperglycemia: Secondary | ICD-10-CM | POA: Diagnosis not present

## 2019-01-02 DIAGNOSIS — R358 Other polyuria: Secondary | ICD-10-CM | POA: Diagnosis not present

## 2019-01-07 DIAGNOSIS — R490 Dysphonia: Secondary | ICD-10-CM | POA: Diagnosis not present

## 2019-01-07 DIAGNOSIS — D491 Neoplasm of unspecified behavior of respiratory system: Secondary | ICD-10-CM | POA: Diagnosis not present

## 2019-01-07 DIAGNOSIS — J040 Acute laryngitis: Secondary | ICD-10-CM | POA: Diagnosis not present

## 2019-01-07 DIAGNOSIS — Z8673 Personal history of transient ischemic attack (TIA), and cerebral infarction without residual deficits: Secondary | ICD-10-CM | POA: Diagnosis not present

## 2019-01-07 DIAGNOSIS — J383 Other diseases of vocal cords: Secondary | ICD-10-CM | POA: Diagnosis not present

## 2019-01-07 DIAGNOSIS — J449 Chronic obstructive pulmonary disease, unspecified: Secondary | ICD-10-CM | POA: Diagnosis not present

## 2019-01-07 DIAGNOSIS — I10 Essential (primary) hypertension: Secondary | ICD-10-CM | POA: Diagnosis not present

## 2019-01-07 DIAGNOSIS — E039 Hypothyroidism, unspecified: Secondary | ICD-10-CM | POA: Diagnosis not present

## 2019-01-07 DIAGNOSIS — K219 Gastro-esophageal reflux disease without esophagitis: Secondary | ICD-10-CM | POA: Diagnosis not present

## 2019-01-07 DIAGNOSIS — F17210 Nicotine dependence, cigarettes, uncomplicated: Secondary | ICD-10-CM | POA: Diagnosis not present

## 2019-01-07 DIAGNOSIS — E785 Hyperlipidemia, unspecified: Secondary | ICD-10-CM | POA: Diagnosis not present

## 2019-01-07 DIAGNOSIS — Z79899 Other long term (current) drug therapy: Secondary | ICD-10-CM | POA: Diagnosis not present

## 2019-01-07 DIAGNOSIS — D380 Neoplasm of uncertain behavior of larynx: Secondary | ICD-10-CM | POA: Diagnosis not present

## 2019-01-07 DIAGNOSIS — Z8521 Personal history of malignant neoplasm of larynx: Secondary | ICD-10-CM | POA: Diagnosis not present

## 2019-01-16 DIAGNOSIS — R490 Dysphonia: Secondary | ICD-10-CM | POA: Diagnosis not present

## 2019-01-16 DIAGNOSIS — F17200 Nicotine dependence, unspecified, uncomplicated: Secondary | ICD-10-CM | POA: Diagnosis not present

## 2019-01-16 DIAGNOSIS — J383 Other diseases of vocal cords: Secondary | ICD-10-CM | POA: Diagnosis not present

## 2019-01-16 DIAGNOSIS — Z9889 Other specified postprocedural states: Secondary | ICD-10-CM | POA: Diagnosis not present

## 2019-01-28 DIAGNOSIS — E1151 Type 2 diabetes mellitus with diabetic peripheral angiopathy without gangrene: Secondary | ICD-10-CM | POA: Diagnosis not present

## 2019-01-28 DIAGNOSIS — Z23 Encounter for immunization: Secondary | ICD-10-CM | POA: Diagnosis not present

## 2019-01-28 DIAGNOSIS — I10 Essential (primary) hypertension: Secondary | ICD-10-CM | POA: Diagnosis not present

## 2019-01-28 DIAGNOSIS — E782 Mixed hyperlipidemia: Secondary | ICD-10-CM | POA: Diagnosis not present

## 2019-01-28 DIAGNOSIS — J383 Other diseases of vocal cords: Secondary | ICD-10-CM | POA: Diagnosis not present

## 2019-01-28 DIAGNOSIS — I779 Disorder of arteries and arterioles, unspecified: Secondary | ICD-10-CM | POA: Diagnosis not present

## 2019-02-20 DIAGNOSIS — E1151 Type 2 diabetes mellitus with diabetic peripheral angiopathy without gangrene: Secondary | ICD-10-CM | POA: Diagnosis not present

## 2019-02-20 DIAGNOSIS — I10 Essential (primary) hypertension: Secondary | ICD-10-CM | POA: Diagnosis not present

## 2019-02-20 DIAGNOSIS — E782 Mixed hyperlipidemia: Secondary | ICD-10-CM | POA: Diagnosis not present

## 2019-03-02 DIAGNOSIS — Z779 Other contact with and (suspected) exposures hazardous to health: Secondary | ICD-10-CM | POA: Diagnosis not present

## 2019-03-02 DIAGNOSIS — Z124 Encounter for screening for malignant neoplasm of cervix: Secondary | ICD-10-CM | POA: Diagnosis not present

## 2019-03-02 DIAGNOSIS — N771 Vaginitis, vulvitis and vulvovaginitis in diseases classified elsewhere: Secondary | ICD-10-CM | POA: Diagnosis not present

## 2019-03-02 DIAGNOSIS — N898 Other specified noninflammatory disorders of vagina: Secondary | ICD-10-CM | POA: Diagnosis not present

## 2019-03-02 DIAGNOSIS — Z8742 Personal history of other diseases of the female genital tract: Secondary | ICD-10-CM | POA: Diagnosis not present

## 2019-03-02 DIAGNOSIS — D3912 Neoplasm of uncertain behavior of left ovary: Secondary | ICD-10-CM | POA: Diagnosis not present

## 2019-03-02 DIAGNOSIS — Z01419 Encounter for gynecological examination (general) (routine) without abnormal findings: Secondary | ICD-10-CM | POA: Diagnosis not present

## 2019-03-02 DIAGNOSIS — E119 Type 2 diabetes mellitus without complications: Secondary | ICD-10-CM | POA: Diagnosis not present

## 2019-03-02 DIAGNOSIS — Z1151 Encounter for screening for human papillomavirus (HPV): Secondary | ICD-10-CM | POA: Diagnosis not present

## 2019-03-20 DIAGNOSIS — R4020 Unspecified coma: Secondary | ICD-10-CM | POA: Diagnosis not present

## 2019-03-20 DIAGNOSIS — Z03818 Encounter for observation for suspected exposure to other biological agents ruled out: Secondary | ICD-10-CM | POA: Diagnosis not present

## 2019-03-20 DIAGNOSIS — E7220 Disorder of urea cycle metabolism, unspecified: Secondary | ICD-10-CM | POA: Diagnosis not present

## 2019-03-20 DIAGNOSIS — K7460 Unspecified cirrhosis of liver: Secondary | ICD-10-CM | POA: Diagnosis not present

## 2019-03-20 DIAGNOSIS — K7290 Hepatic failure, unspecified without coma: Secondary | ICD-10-CM | POA: Diagnosis not present

## 2019-03-20 DIAGNOSIS — R1011 Right upper quadrant pain: Secondary | ICD-10-CM | POA: Diagnosis not present

## 2019-03-20 DIAGNOSIS — E872 Acidosis: Secondary | ICD-10-CM | POA: Diagnosis not present

## 2019-03-20 DIAGNOSIS — D62 Acute posthemorrhagic anemia: Secondary | ICD-10-CM | POA: Diagnosis not present

## 2019-03-20 DIAGNOSIS — N179 Acute kidney failure, unspecified: Secondary | ICD-10-CM | POA: Diagnosis not present

## 2019-03-20 DIAGNOSIS — K922 Gastrointestinal hemorrhage, unspecified: Secondary | ICD-10-CM | POA: Diagnosis not present

## 2019-03-20 DIAGNOSIS — S299XXA Unspecified injury of thorax, initial encounter: Secondary | ICD-10-CM | POA: Diagnosis not present

## 2019-03-21 DIAGNOSIS — R578 Other shock: Secondary | ICD-10-CM | POA: Diagnosis not present

## 2019-03-21 DIAGNOSIS — J9602 Acute respiratory failure with hypercapnia: Secondary | ICD-10-CM | POA: Diagnosis not present

## 2019-03-21 DIAGNOSIS — R1310 Dysphagia, unspecified: Secondary | ICD-10-CM | POA: Diagnosis not present

## 2019-03-21 DIAGNOSIS — E870 Hyperosmolality and hypernatremia: Secondary | ICD-10-CM | POA: Diagnosis not present

## 2019-03-21 DIAGNOSIS — E119 Type 2 diabetes mellitus without complications: Secondary | ICD-10-CM | POA: Diagnosis not present

## 2019-03-21 DIAGNOSIS — K7030 Alcoholic cirrhosis of liver without ascites: Secondary | ICD-10-CM | POA: Diagnosis not present

## 2019-03-21 DIAGNOSIS — E785 Hyperlipidemia, unspecified: Secondary | ICD-10-CM | POA: Diagnosis not present

## 2019-03-21 DIAGNOSIS — A048 Other specified bacterial intestinal infections: Secondary | ICD-10-CM | POA: Diagnosis not present

## 2019-03-21 DIAGNOSIS — K2100 Gastro-esophageal reflux disease with esophagitis, without bleeding: Secondary | ICD-10-CM | POA: Diagnosis not present

## 2019-03-21 DIAGNOSIS — I8501 Esophageal varices with bleeding: Secondary | ICD-10-CM | POA: Diagnosis not present

## 2019-03-21 DIAGNOSIS — Z794 Long term (current) use of insulin: Secondary | ICD-10-CM | POA: Diagnosis not present

## 2019-03-21 DIAGNOSIS — F1010 Alcohol abuse, uncomplicated: Secondary | ICD-10-CM | POA: Diagnosis not present

## 2019-03-21 DIAGNOSIS — E871 Hypo-osmolality and hyponatremia: Secondary | ICD-10-CM | POA: Diagnosis not present

## 2019-03-21 DIAGNOSIS — N179 Acute kidney failure, unspecified: Secondary | ICD-10-CM | POA: Diagnosis not present

## 2019-03-21 DIAGNOSIS — J449 Chronic obstructive pulmonary disease, unspecified: Secondary | ICD-10-CM | POA: Diagnosis not present

## 2019-03-21 DIAGNOSIS — K766 Portal hypertension: Secondary | ICD-10-CM | POA: Diagnosis not present

## 2019-03-21 DIAGNOSIS — Z781 Physical restraint status: Secondary | ICD-10-CM | POA: Diagnosis not present

## 2019-03-21 DIAGNOSIS — E7800 Pure hypercholesterolemia, unspecified: Secondary | ICD-10-CM | POA: Diagnosis not present

## 2019-03-21 DIAGNOSIS — F17210 Nicotine dependence, cigarettes, uncomplicated: Secondary | ICD-10-CM | POA: Diagnosis not present

## 2019-03-21 DIAGNOSIS — I959 Hypotension, unspecified: Secondary | ICD-10-CM | POA: Diagnosis not present

## 2019-03-21 DIAGNOSIS — R410 Disorientation, unspecified: Secondary | ICD-10-CM | POA: Diagnosis not present

## 2019-03-21 DIAGNOSIS — R600 Localized edema: Secondary | ICD-10-CM | POA: Diagnosis not present

## 2019-03-21 DIAGNOSIS — F1019 Alcohol abuse with unspecified alcohol-induced disorder: Secondary | ICD-10-CM | POA: Diagnosis not present

## 2019-03-21 DIAGNOSIS — I8511 Secondary esophageal varices with bleeding: Secondary | ICD-10-CM | POA: Diagnosis not present

## 2019-03-21 DIAGNOSIS — B9681 Helicobacter pylori [H. pylori] as the cause of diseases classified elsewhere: Secondary | ICD-10-CM | POA: Diagnosis not present

## 2019-03-21 DIAGNOSIS — K920 Hematemesis: Secondary | ICD-10-CM | POA: Diagnosis not present

## 2019-03-21 DIAGNOSIS — E872 Acidosis: Secondary | ICD-10-CM | POA: Diagnosis not present

## 2019-03-21 DIAGNOSIS — D688 Other specified coagulation defects: Secondary | ICD-10-CM | POA: Diagnosis not present

## 2019-03-21 DIAGNOSIS — K7040 Alcoholic hepatic failure without coma: Secondary | ICD-10-CM | POA: Diagnosis not present

## 2019-03-21 DIAGNOSIS — E039 Hypothyroidism, unspecified: Secondary | ICD-10-CM | POA: Diagnosis not present

## 2019-03-21 DIAGNOSIS — M21372 Foot drop, left foot: Secondary | ICD-10-CM | POA: Diagnosis not present

## 2019-03-21 DIAGNOSIS — I10 Essential (primary) hypertension: Secondary | ICD-10-CM | POA: Diagnosis not present

## 2019-03-21 DIAGNOSIS — K449 Diaphragmatic hernia without obstruction or gangrene: Secondary | ICD-10-CM | POA: Diagnosis not present

## 2019-03-21 DIAGNOSIS — G8929 Other chronic pain: Secondary | ICD-10-CM | POA: Diagnosis not present

## 2019-03-21 DIAGNOSIS — J9811 Atelectasis: Secondary | ICD-10-CM | POA: Diagnosis not present

## 2019-03-21 DIAGNOSIS — Z4682 Encounter for fitting and adjustment of non-vascular catheter: Secondary | ICD-10-CM | POA: Diagnosis not present

## 2019-03-21 DIAGNOSIS — J690 Pneumonitis due to inhalation of food and vomit: Secondary | ICD-10-CM | POA: Diagnosis not present

## 2019-03-21 DIAGNOSIS — R579 Shock, unspecified: Secondary | ICD-10-CM | POA: Diagnosis not present

## 2019-03-21 DIAGNOSIS — J9601 Acute respiratory failure with hypoxia: Secondary | ICD-10-CM | POA: Diagnosis not present

## 2019-03-21 DIAGNOSIS — K7290 Hepatic failure, unspecified without coma: Secondary | ICD-10-CM | POA: Diagnosis not present

## 2019-03-21 DIAGNOSIS — K7460 Unspecified cirrhosis of liver: Secondary | ICD-10-CM | POA: Diagnosis not present

## 2019-03-21 DIAGNOSIS — R9431 Abnormal electrocardiogram [ECG] [EKG]: Secondary | ICD-10-CM | POA: Diagnosis not present

## 2019-03-21 DIAGNOSIS — K7200 Acute and subacute hepatic failure without coma: Secondary | ICD-10-CM | POA: Diagnosis not present

## 2019-03-21 DIAGNOSIS — J810 Acute pulmonary edema: Secondary | ICD-10-CM | POA: Diagnosis not present

## 2019-03-21 DIAGNOSIS — J189 Pneumonia, unspecified organism: Secondary | ICD-10-CM | POA: Diagnosis not present

## 2019-03-21 DIAGNOSIS — K922 Gastrointestinal hemorrhage, unspecified: Secondary | ICD-10-CM | POA: Diagnosis not present

## 2019-03-21 DIAGNOSIS — J90 Pleural effusion, not elsewhere classified: Secondary | ICD-10-CM | POA: Diagnosis not present

## 2019-03-21 DIAGNOSIS — D72829 Elevated white blood cell count, unspecified: Secondary | ICD-10-CM | POA: Diagnosis not present

## 2019-03-21 DIAGNOSIS — Z20828 Contact with and (suspected) exposure to other viral communicable diseases: Secondary | ICD-10-CM | POA: Diagnosis not present

## 2019-03-21 DIAGNOSIS — E1142 Type 2 diabetes mellitus with diabetic polyneuropathy: Secondary | ICD-10-CM | POA: Diagnosis not present

## 2019-04-12 DIAGNOSIS — D509 Iron deficiency anemia, unspecified: Secondary | ICD-10-CM | POA: Diagnosis not present

## 2019-04-12 DIAGNOSIS — K2100 Gastro-esophageal reflux disease with esophagitis, without bleeding: Secondary | ICD-10-CM | POA: Diagnosis not present

## 2019-04-12 DIAGNOSIS — J449 Chronic obstructive pulmonary disease, unspecified: Secondary | ICD-10-CM | POA: Diagnosis not present

## 2019-04-12 DIAGNOSIS — M5412 Radiculopathy, cervical region: Secondary | ICD-10-CM | POA: Diagnosis not present

## 2019-04-12 DIAGNOSIS — F339 Major depressive disorder, recurrent, unspecified: Secondary | ICD-10-CM | POA: Diagnosis not present

## 2019-04-12 DIAGNOSIS — J309 Allergic rhinitis, unspecified: Secondary | ICD-10-CM | POA: Diagnosis not present

## 2019-04-12 DIAGNOSIS — E039 Hypothyroidism, unspecified: Secondary | ICD-10-CM | POA: Diagnosis not present

## 2019-04-12 DIAGNOSIS — I8500 Esophageal varices without bleeding: Secondary | ICD-10-CM | POA: Diagnosis not present

## 2019-04-12 DIAGNOSIS — M8580 Other specified disorders of bone density and structure, unspecified site: Secondary | ICD-10-CM | POA: Diagnosis not present

## 2019-04-12 DIAGNOSIS — E559 Vitamin D deficiency, unspecified: Secondary | ICD-10-CM | POA: Diagnosis not present

## 2019-04-12 DIAGNOSIS — B9681 Helicobacter pylori [H. pylori] as the cause of diseases classified elsewhere: Secondary | ICD-10-CM | POA: Diagnosis not present

## 2019-04-12 DIAGNOSIS — K7200 Acute and subacute hepatic failure without coma: Secondary | ICD-10-CM | POA: Diagnosis not present

## 2019-04-12 DIAGNOSIS — F1019 Alcohol abuse with unspecified alcohol-induced disorder: Secondary | ICD-10-CM | POA: Diagnosis not present

## 2019-04-12 DIAGNOSIS — G5600 Carpal tunnel syndrome, unspecified upper limb: Secondary | ICD-10-CM | POA: Diagnosis not present

## 2019-04-12 DIAGNOSIS — E538 Deficiency of other specified B group vitamins: Secondary | ICD-10-CM | POA: Diagnosis not present

## 2019-04-12 DIAGNOSIS — I69354 Hemiplegia and hemiparesis following cerebral infarction affecting left non-dominant side: Secondary | ICD-10-CM | POA: Diagnosis not present

## 2019-04-12 DIAGNOSIS — E1140 Type 2 diabetes mellitus with diabetic neuropathy, unspecified: Secondary | ICD-10-CM | POA: Diagnosis not present

## 2019-04-12 DIAGNOSIS — F17210 Nicotine dependence, cigarettes, uncomplicated: Secondary | ICD-10-CM | POA: Diagnosis not present

## 2019-04-12 DIAGNOSIS — R1310 Dysphagia, unspecified: Secondary | ICD-10-CM | POA: Diagnosis not present

## 2019-04-12 DIAGNOSIS — F419 Anxiety disorder, unspecified: Secondary | ICD-10-CM | POA: Diagnosis not present

## 2019-04-12 DIAGNOSIS — K7030 Alcoholic cirrhosis of liver without ascites: Secondary | ICD-10-CM | POA: Diagnosis not present

## 2019-04-12 DIAGNOSIS — E785 Hyperlipidemia, unspecified: Secondary | ICD-10-CM | POA: Diagnosis not present

## 2019-04-12 DIAGNOSIS — M5416 Radiculopathy, lumbar region: Secondary | ICD-10-CM | POA: Diagnosis not present

## 2019-04-12 DIAGNOSIS — I10 Essential (primary) hypertension: Secondary | ICD-10-CM | POA: Diagnosis not present

## 2019-04-12 DIAGNOSIS — I779 Disorder of arteries and arterioles, unspecified: Secondary | ICD-10-CM | POA: Diagnosis not present

## 2019-04-15 DIAGNOSIS — I129 Hypertensive chronic kidney disease with stage 1 through stage 4 chronic kidney disease, or unspecified chronic kidney disease: Secondary | ICD-10-CM | POA: Diagnosis not present

## 2019-04-15 DIAGNOSIS — K7030 Alcoholic cirrhosis of liver without ascites: Secondary | ICD-10-CM | POA: Diagnosis not present

## 2019-04-15 DIAGNOSIS — K7290 Hepatic failure, unspecified without coma: Secondary | ICD-10-CM | POA: Diagnosis not present

## 2019-04-15 DIAGNOSIS — I8511 Secondary esophageal varices with bleeding: Secondary | ICD-10-CM | POA: Diagnosis not present

## 2019-04-15 DIAGNOSIS — J9601 Acute respiratory failure with hypoxia: Secondary | ICD-10-CM | POA: Diagnosis not present

## 2019-04-15 DIAGNOSIS — A048 Other specified bacterial intestinal infections: Secondary | ICD-10-CM | POA: Diagnosis not present

## 2019-04-15 DIAGNOSIS — E1169 Type 2 diabetes mellitus with other specified complication: Secondary | ICD-10-CM | POA: Diagnosis not present

## 2019-04-15 DIAGNOSIS — D500 Iron deficiency anemia secondary to blood loss (chronic): Secondary | ICD-10-CM | POA: Diagnosis not present

## 2019-04-15 DIAGNOSIS — Z794 Long term (current) use of insulin: Secondary | ICD-10-CM | POA: Diagnosis not present

## 2019-04-15 DIAGNOSIS — E1142 Type 2 diabetes mellitus with diabetic polyneuropathy: Secondary | ICD-10-CM | POA: Diagnosis not present

## 2019-04-15 DIAGNOSIS — E440 Moderate protein-calorie malnutrition: Secondary | ICD-10-CM | POA: Diagnosis not present

## 2019-04-15 DIAGNOSIS — F1019 Alcohol abuse with unspecified alcohol-induced disorder: Secondary | ICD-10-CM | POA: Diagnosis not present

## 2019-04-15 DIAGNOSIS — R0602 Shortness of breath: Secondary | ICD-10-CM | POA: Diagnosis not present

## 2019-04-15 DIAGNOSIS — J410 Simple chronic bronchitis: Secondary | ICD-10-CM | POA: Diagnosis not present

## 2019-04-16 DIAGNOSIS — F17210 Nicotine dependence, cigarettes, uncomplicated: Secondary | ICD-10-CM | POA: Diagnosis not present

## 2019-04-16 DIAGNOSIS — E039 Hypothyroidism, unspecified: Secondary | ICD-10-CM | POA: Diagnosis not present

## 2019-04-16 DIAGNOSIS — M8580 Other specified disorders of bone density and structure, unspecified site: Secondary | ICD-10-CM | POA: Diagnosis not present

## 2019-04-16 DIAGNOSIS — K7030 Alcoholic cirrhosis of liver without ascites: Secondary | ICD-10-CM | POA: Diagnosis not present

## 2019-04-16 DIAGNOSIS — I779 Disorder of arteries and arterioles, unspecified: Secondary | ICD-10-CM | POA: Diagnosis not present

## 2019-04-16 DIAGNOSIS — I8511 Secondary esophageal varices with bleeding: Secondary | ICD-10-CM | POA: Diagnosis not present

## 2019-04-16 DIAGNOSIS — K7200 Acute and subacute hepatic failure without coma: Secondary | ICD-10-CM | POA: Diagnosis not present

## 2019-04-16 DIAGNOSIS — J449 Chronic obstructive pulmonary disease, unspecified: Secondary | ICD-10-CM | POA: Diagnosis not present

## 2019-04-16 DIAGNOSIS — F419 Anxiety disorder, unspecified: Secondary | ICD-10-CM | POA: Diagnosis not present

## 2019-04-16 DIAGNOSIS — J309 Allergic rhinitis, unspecified: Secondary | ICD-10-CM | POA: Diagnosis not present

## 2019-04-16 DIAGNOSIS — R1310 Dysphagia, unspecified: Secondary | ICD-10-CM | POA: Diagnosis not present

## 2019-04-16 DIAGNOSIS — D509 Iron deficiency anemia, unspecified: Secondary | ICD-10-CM | POA: Diagnosis not present

## 2019-04-16 DIAGNOSIS — K2100 Gastro-esophageal reflux disease with esophagitis, without bleeding: Secondary | ICD-10-CM | POA: Diagnosis not present

## 2019-04-16 DIAGNOSIS — Z01812 Encounter for preprocedural laboratory examination: Secondary | ICD-10-CM | POA: Diagnosis not present

## 2019-04-16 DIAGNOSIS — M5416 Radiculopathy, lumbar region: Secondary | ICD-10-CM | POA: Diagnosis not present

## 2019-04-16 DIAGNOSIS — F339 Major depressive disorder, recurrent, unspecified: Secondary | ICD-10-CM | POA: Diagnosis not present

## 2019-04-16 DIAGNOSIS — B9681 Helicobacter pylori [H. pylori] as the cause of diseases classified elsewhere: Secondary | ICD-10-CM | POA: Diagnosis not present

## 2019-04-16 DIAGNOSIS — Z20822 Contact with and (suspected) exposure to covid-19: Secondary | ICD-10-CM | POA: Diagnosis not present

## 2019-04-16 DIAGNOSIS — K7460 Unspecified cirrhosis of liver: Secondary | ICD-10-CM | POA: Diagnosis not present

## 2019-04-16 DIAGNOSIS — G5600 Carpal tunnel syndrome, unspecified upper limb: Secondary | ICD-10-CM | POA: Diagnosis not present

## 2019-04-16 DIAGNOSIS — E559 Vitamin D deficiency, unspecified: Secondary | ICD-10-CM | POA: Diagnosis not present

## 2019-04-16 DIAGNOSIS — E1140 Type 2 diabetes mellitus with diabetic neuropathy, unspecified: Secondary | ICD-10-CM | POA: Diagnosis not present

## 2019-04-16 DIAGNOSIS — F1019 Alcohol abuse with unspecified alcohol-induced disorder: Secondary | ICD-10-CM | POA: Diagnosis not present

## 2019-04-16 DIAGNOSIS — I10 Essential (primary) hypertension: Secondary | ICD-10-CM | POA: Diagnosis not present

## 2019-04-16 DIAGNOSIS — E538 Deficiency of other specified B group vitamins: Secondary | ICD-10-CM | POA: Diagnosis not present

## 2019-04-16 DIAGNOSIS — I69354 Hemiplegia and hemiparesis following cerebral infarction affecting left non-dominant side: Secondary | ICD-10-CM | POA: Diagnosis not present

## 2019-04-16 DIAGNOSIS — I8500 Esophageal varices without bleeding: Secondary | ICD-10-CM | POA: Diagnosis not present

## 2019-04-16 DIAGNOSIS — E785 Hyperlipidemia, unspecified: Secondary | ICD-10-CM | POA: Diagnosis not present

## 2019-04-16 DIAGNOSIS — M5412 Radiculopathy, cervical region: Secondary | ICD-10-CM | POA: Diagnosis not present

## 2019-04-22 DIAGNOSIS — K7460 Unspecified cirrhosis of liver: Secondary | ICD-10-CM | POA: Diagnosis not present

## 2019-04-22 DIAGNOSIS — K3189 Other diseases of stomach and duodenum: Secondary | ICD-10-CM | POA: Diagnosis not present

## 2019-04-22 DIAGNOSIS — I8511 Secondary esophageal varices with bleeding: Secondary | ICD-10-CM | POA: Diagnosis not present

## 2019-04-22 DIAGNOSIS — I8500 Esophageal varices without bleeding: Secondary | ICD-10-CM | POA: Diagnosis not present

## 2019-04-22 DIAGNOSIS — K209 Esophagitis, unspecified without bleeding: Secondary | ICD-10-CM | POA: Diagnosis not present

## 2019-04-22 DIAGNOSIS — K766 Portal hypertension: Secondary | ICD-10-CM | POA: Diagnosis not present

## 2019-04-22 DIAGNOSIS — Z7289 Other problems related to lifestyle: Secondary | ICD-10-CM | POA: Diagnosis not present

## 2019-04-27 DIAGNOSIS — J309 Allergic rhinitis, unspecified: Secondary | ICD-10-CM | POA: Diagnosis not present

## 2019-04-27 DIAGNOSIS — G5600 Carpal tunnel syndrome, unspecified upper limb: Secondary | ICD-10-CM | POA: Diagnosis not present

## 2019-04-27 DIAGNOSIS — K7030 Alcoholic cirrhosis of liver without ascites: Secondary | ICD-10-CM | POA: Diagnosis not present

## 2019-04-27 DIAGNOSIS — M5412 Radiculopathy, cervical region: Secondary | ICD-10-CM | POA: Diagnosis not present

## 2019-04-27 DIAGNOSIS — E039 Hypothyroidism, unspecified: Secondary | ICD-10-CM | POA: Diagnosis not present

## 2019-04-27 DIAGNOSIS — B9681 Helicobacter pylori [H. pylori] as the cause of diseases classified elsewhere: Secondary | ICD-10-CM | POA: Diagnosis not present

## 2019-04-27 DIAGNOSIS — M5416 Radiculopathy, lumbar region: Secondary | ICD-10-CM | POA: Diagnosis not present

## 2019-04-27 DIAGNOSIS — K2100 Gastro-esophageal reflux disease with esophagitis, without bleeding: Secondary | ICD-10-CM | POA: Diagnosis not present

## 2019-04-27 DIAGNOSIS — E538 Deficiency of other specified B group vitamins: Secondary | ICD-10-CM | POA: Diagnosis not present

## 2019-04-27 DIAGNOSIS — K7200 Acute and subacute hepatic failure without coma: Secondary | ICD-10-CM | POA: Diagnosis not present

## 2019-04-27 DIAGNOSIS — R1310 Dysphagia, unspecified: Secondary | ICD-10-CM | POA: Diagnosis not present

## 2019-04-27 DIAGNOSIS — M8580 Other specified disorders of bone density and structure, unspecified site: Secondary | ICD-10-CM | POA: Diagnosis not present

## 2019-04-27 DIAGNOSIS — E1140 Type 2 diabetes mellitus with diabetic neuropathy, unspecified: Secondary | ICD-10-CM | POA: Diagnosis not present

## 2019-04-27 DIAGNOSIS — D509 Iron deficiency anemia, unspecified: Secondary | ICD-10-CM | POA: Diagnosis not present

## 2019-04-27 DIAGNOSIS — F419 Anxiety disorder, unspecified: Secondary | ICD-10-CM | POA: Diagnosis not present

## 2019-04-27 DIAGNOSIS — I8500 Esophageal varices without bleeding: Secondary | ICD-10-CM | POA: Diagnosis not present

## 2019-04-27 DIAGNOSIS — I779 Disorder of arteries and arterioles, unspecified: Secondary | ICD-10-CM | POA: Diagnosis not present

## 2019-04-27 DIAGNOSIS — E785 Hyperlipidemia, unspecified: Secondary | ICD-10-CM | POA: Diagnosis not present

## 2019-04-27 DIAGNOSIS — F1019 Alcohol abuse with unspecified alcohol-induced disorder: Secondary | ICD-10-CM | POA: Diagnosis not present

## 2019-04-27 DIAGNOSIS — J449 Chronic obstructive pulmonary disease, unspecified: Secondary | ICD-10-CM | POA: Diagnosis not present

## 2019-04-27 DIAGNOSIS — I69354 Hemiplegia and hemiparesis following cerebral infarction affecting left non-dominant side: Secondary | ICD-10-CM | POA: Diagnosis not present

## 2019-04-27 DIAGNOSIS — E559 Vitamin D deficiency, unspecified: Secondary | ICD-10-CM | POA: Diagnosis not present

## 2019-04-27 DIAGNOSIS — F339 Major depressive disorder, recurrent, unspecified: Secondary | ICD-10-CM | POA: Diagnosis not present

## 2019-04-27 DIAGNOSIS — F17210 Nicotine dependence, cigarettes, uncomplicated: Secondary | ICD-10-CM | POA: Diagnosis not present

## 2019-04-27 DIAGNOSIS — I10 Essential (primary) hypertension: Secondary | ICD-10-CM | POA: Diagnosis not present

## 2019-04-28 DIAGNOSIS — R1314 Dysphagia, pharyngoesophageal phase: Secondary | ICD-10-CM | POA: Diagnosis not present

## 2019-04-30 DIAGNOSIS — I779 Disorder of arteries and arterioles, unspecified: Secondary | ICD-10-CM | POA: Diagnosis not present

## 2019-04-30 DIAGNOSIS — E785 Hyperlipidemia, unspecified: Secondary | ICD-10-CM | POA: Diagnosis not present

## 2019-04-30 DIAGNOSIS — E039 Hypothyroidism, unspecified: Secondary | ICD-10-CM | POA: Diagnosis not present

## 2019-04-30 DIAGNOSIS — J449 Chronic obstructive pulmonary disease, unspecified: Secondary | ICD-10-CM | POA: Diagnosis not present

## 2019-04-30 DIAGNOSIS — M8580 Other specified disorders of bone density and structure, unspecified site: Secondary | ICD-10-CM | POA: Diagnosis not present

## 2019-04-30 DIAGNOSIS — R1310 Dysphagia, unspecified: Secondary | ICD-10-CM | POA: Diagnosis not present

## 2019-04-30 DIAGNOSIS — K7200 Acute and subacute hepatic failure without coma: Secondary | ICD-10-CM | POA: Diagnosis not present

## 2019-04-30 DIAGNOSIS — F339 Major depressive disorder, recurrent, unspecified: Secondary | ICD-10-CM | POA: Diagnosis not present

## 2019-04-30 DIAGNOSIS — E1140 Type 2 diabetes mellitus with diabetic neuropathy, unspecified: Secondary | ICD-10-CM | POA: Diagnosis not present

## 2019-04-30 DIAGNOSIS — G5600 Carpal tunnel syndrome, unspecified upper limb: Secondary | ICD-10-CM | POA: Diagnosis not present

## 2019-04-30 DIAGNOSIS — F419 Anxiety disorder, unspecified: Secondary | ICD-10-CM | POA: Diagnosis not present

## 2019-04-30 DIAGNOSIS — B9681 Helicobacter pylori [H. pylori] as the cause of diseases classified elsewhere: Secondary | ICD-10-CM | POA: Diagnosis not present

## 2019-04-30 DIAGNOSIS — K2100 Gastro-esophageal reflux disease with esophagitis, without bleeding: Secondary | ICD-10-CM | POA: Diagnosis not present

## 2019-04-30 DIAGNOSIS — I8500 Esophageal varices without bleeding: Secondary | ICD-10-CM | POA: Diagnosis not present

## 2019-04-30 DIAGNOSIS — D509 Iron deficiency anemia, unspecified: Secondary | ICD-10-CM | POA: Diagnosis not present

## 2019-04-30 DIAGNOSIS — K7030 Alcoholic cirrhosis of liver without ascites: Secondary | ICD-10-CM | POA: Diagnosis not present

## 2019-04-30 DIAGNOSIS — I10 Essential (primary) hypertension: Secondary | ICD-10-CM | POA: Diagnosis not present

## 2019-04-30 DIAGNOSIS — E538 Deficiency of other specified B group vitamins: Secondary | ICD-10-CM | POA: Diagnosis not present

## 2019-04-30 DIAGNOSIS — M5412 Radiculopathy, cervical region: Secondary | ICD-10-CM | POA: Diagnosis not present

## 2019-04-30 DIAGNOSIS — I69354 Hemiplegia and hemiparesis following cerebral infarction affecting left non-dominant side: Secondary | ICD-10-CM | POA: Diagnosis not present

## 2019-04-30 DIAGNOSIS — M5416 Radiculopathy, lumbar region: Secondary | ICD-10-CM | POA: Diagnosis not present

## 2019-04-30 DIAGNOSIS — F1019 Alcohol abuse with unspecified alcohol-induced disorder: Secondary | ICD-10-CM | POA: Diagnosis not present

## 2019-04-30 DIAGNOSIS — F17210 Nicotine dependence, cigarettes, uncomplicated: Secondary | ICD-10-CM | POA: Diagnosis not present

## 2019-04-30 DIAGNOSIS — J309 Allergic rhinitis, unspecified: Secondary | ICD-10-CM | POA: Diagnosis not present

## 2019-04-30 DIAGNOSIS — E559 Vitamin D deficiency, unspecified: Secondary | ICD-10-CM | POA: Diagnosis not present

## 2019-05-05 DIAGNOSIS — K7030 Alcoholic cirrhosis of liver without ascites: Secondary | ICD-10-CM | POA: Diagnosis not present

## 2019-05-05 DIAGNOSIS — D509 Iron deficiency anemia, unspecified: Secondary | ICD-10-CM | POA: Diagnosis not present

## 2019-05-05 DIAGNOSIS — M8580 Other specified disorders of bone density and structure, unspecified site: Secondary | ICD-10-CM | POA: Diagnosis not present

## 2019-05-05 DIAGNOSIS — M5416 Radiculopathy, lumbar region: Secondary | ICD-10-CM | POA: Diagnosis not present

## 2019-05-05 DIAGNOSIS — B9681 Helicobacter pylori [H. pylori] as the cause of diseases classified elsewhere: Secondary | ICD-10-CM | POA: Diagnosis not present

## 2019-05-05 DIAGNOSIS — F339 Major depressive disorder, recurrent, unspecified: Secondary | ICD-10-CM | POA: Diagnosis not present

## 2019-05-05 DIAGNOSIS — E538 Deficiency of other specified B group vitamins: Secondary | ICD-10-CM | POA: Diagnosis not present

## 2019-05-05 DIAGNOSIS — F419 Anxiety disorder, unspecified: Secondary | ICD-10-CM | POA: Diagnosis not present

## 2019-05-05 DIAGNOSIS — I8500 Esophageal varices without bleeding: Secondary | ICD-10-CM | POA: Diagnosis not present

## 2019-05-05 DIAGNOSIS — E039 Hypothyroidism, unspecified: Secondary | ICD-10-CM | POA: Diagnosis not present

## 2019-05-05 DIAGNOSIS — E559 Vitamin D deficiency, unspecified: Secondary | ICD-10-CM | POA: Diagnosis not present

## 2019-05-05 DIAGNOSIS — J449 Chronic obstructive pulmonary disease, unspecified: Secondary | ICD-10-CM | POA: Diagnosis not present

## 2019-05-05 DIAGNOSIS — M5412 Radiculopathy, cervical region: Secondary | ICD-10-CM | POA: Diagnosis not present

## 2019-05-05 DIAGNOSIS — I10 Essential (primary) hypertension: Secondary | ICD-10-CM | POA: Diagnosis not present

## 2019-05-05 DIAGNOSIS — K7200 Acute and subacute hepatic failure without coma: Secondary | ICD-10-CM | POA: Diagnosis not present

## 2019-05-05 DIAGNOSIS — G5600 Carpal tunnel syndrome, unspecified upper limb: Secondary | ICD-10-CM | POA: Diagnosis not present

## 2019-05-05 DIAGNOSIS — R1310 Dysphagia, unspecified: Secondary | ICD-10-CM | POA: Diagnosis not present

## 2019-05-05 DIAGNOSIS — F1019 Alcohol abuse with unspecified alcohol-induced disorder: Secondary | ICD-10-CM | POA: Diagnosis not present

## 2019-05-05 DIAGNOSIS — F17210 Nicotine dependence, cigarettes, uncomplicated: Secondary | ICD-10-CM | POA: Diagnosis not present

## 2019-05-05 DIAGNOSIS — K2100 Gastro-esophageal reflux disease with esophagitis, without bleeding: Secondary | ICD-10-CM | POA: Diagnosis not present

## 2019-05-05 DIAGNOSIS — E1140 Type 2 diabetes mellitus with diabetic neuropathy, unspecified: Secondary | ICD-10-CM | POA: Diagnosis not present

## 2019-05-05 DIAGNOSIS — E785 Hyperlipidemia, unspecified: Secondary | ICD-10-CM | POA: Diagnosis not present

## 2019-05-05 DIAGNOSIS — J309 Allergic rhinitis, unspecified: Secondary | ICD-10-CM | POA: Diagnosis not present

## 2019-05-05 DIAGNOSIS — I779 Disorder of arteries and arterioles, unspecified: Secondary | ICD-10-CM | POA: Diagnosis not present

## 2019-05-05 DIAGNOSIS — I69354 Hemiplegia and hemiparesis following cerebral infarction affecting left non-dominant side: Secondary | ICD-10-CM | POA: Diagnosis not present

## 2019-05-07 DIAGNOSIS — R1310 Dysphagia, unspecified: Secondary | ICD-10-CM | POA: Diagnosis not present

## 2019-05-07 DIAGNOSIS — M5412 Radiculopathy, cervical region: Secondary | ICD-10-CM | POA: Diagnosis not present

## 2019-05-07 DIAGNOSIS — M5416 Radiculopathy, lumbar region: Secondary | ICD-10-CM | POA: Diagnosis not present

## 2019-05-07 DIAGNOSIS — I8500 Esophageal varices without bleeding: Secondary | ICD-10-CM | POA: Diagnosis not present

## 2019-05-07 DIAGNOSIS — G5600 Carpal tunnel syndrome, unspecified upper limb: Secondary | ICD-10-CM | POA: Diagnosis not present

## 2019-05-07 DIAGNOSIS — F339 Major depressive disorder, recurrent, unspecified: Secondary | ICD-10-CM | POA: Diagnosis not present

## 2019-05-07 DIAGNOSIS — B9681 Helicobacter pylori [H. pylori] as the cause of diseases classified elsewhere: Secondary | ICD-10-CM | POA: Diagnosis not present

## 2019-05-07 DIAGNOSIS — F419 Anxiety disorder, unspecified: Secondary | ICD-10-CM | POA: Diagnosis not present

## 2019-05-07 DIAGNOSIS — K7030 Alcoholic cirrhosis of liver without ascites: Secondary | ICD-10-CM | POA: Diagnosis not present

## 2019-05-07 DIAGNOSIS — E785 Hyperlipidemia, unspecified: Secondary | ICD-10-CM | POA: Diagnosis not present

## 2019-05-07 DIAGNOSIS — K7200 Acute and subacute hepatic failure without coma: Secondary | ICD-10-CM | POA: Diagnosis not present

## 2019-05-07 DIAGNOSIS — E039 Hypothyroidism, unspecified: Secondary | ICD-10-CM | POA: Diagnosis not present

## 2019-05-07 DIAGNOSIS — F17210 Nicotine dependence, cigarettes, uncomplicated: Secondary | ICD-10-CM | POA: Diagnosis not present

## 2019-05-07 DIAGNOSIS — E559 Vitamin D deficiency, unspecified: Secondary | ICD-10-CM | POA: Diagnosis not present

## 2019-05-07 DIAGNOSIS — I69354 Hemiplegia and hemiparesis following cerebral infarction affecting left non-dominant side: Secondary | ICD-10-CM | POA: Diagnosis not present

## 2019-05-07 DIAGNOSIS — F1019 Alcohol abuse with unspecified alcohol-induced disorder: Secondary | ICD-10-CM | POA: Diagnosis not present

## 2019-05-07 DIAGNOSIS — E1140 Type 2 diabetes mellitus with diabetic neuropathy, unspecified: Secondary | ICD-10-CM | POA: Diagnosis not present

## 2019-05-07 DIAGNOSIS — M8580 Other specified disorders of bone density and structure, unspecified site: Secondary | ICD-10-CM | POA: Diagnosis not present

## 2019-05-07 DIAGNOSIS — K2100 Gastro-esophageal reflux disease with esophagitis, without bleeding: Secondary | ICD-10-CM | POA: Diagnosis not present

## 2019-05-07 DIAGNOSIS — J449 Chronic obstructive pulmonary disease, unspecified: Secondary | ICD-10-CM | POA: Diagnosis not present

## 2019-05-07 DIAGNOSIS — J309 Allergic rhinitis, unspecified: Secondary | ICD-10-CM | POA: Diagnosis not present

## 2019-05-07 DIAGNOSIS — I779 Disorder of arteries and arterioles, unspecified: Secondary | ICD-10-CM | POA: Diagnosis not present

## 2019-05-07 DIAGNOSIS — E538 Deficiency of other specified B group vitamins: Secondary | ICD-10-CM | POA: Diagnosis not present

## 2019-05-07 DIAGNOSIS — D509 Iron deficiency anemia, unspecified: Secondary | ICD-10-CM | POA: Diagnosis not present

## 2019-05-07 DIAGNOSIS — I10 Essential (primary) hypertension: Secondary | ICD-10-CM | POA: Diagnosis not present

## 2019-05-12 DIAGNOSIS — E538 Deficiency of other specified B group vitamins: Secondary | ICD-10-CM | POA: Diagnosis not present

## 2019-05-12 DIAGNOSIS — D509 Iron deficiency anemia, unspecified: Secondary | ICD-10-CM | POA: Diagnosis not present

## 2019-05-12 DIAGNOSIS — I779 Disorder of arteries and arterioles, unspecified: Secondary | ICD-10-CM | POA: Diagnosis not present

## 2019-05-12 DIAGNOSIS — R1310 Dysphagia, unspecified: Secondary | ICD-10-CM | POA: Diagnosis not present

## 2019-05-12 DIAGNOSIS — K7200 Acute and subacute hepatic failure without coma: Secondary | ICD-10-CM | POA: Diagnosis not present

## 2019-05-12 DIAGNOSIS — F17210 Nicotine dependence, cigarettes, uncomplicated: Secondary | ICD-10-CM | POA: Diagnosis not present

## 2019-05-12 DIAGNOSIS — I10 Essential (primary) hypertension: Secondary | ICD-10-CM | POA: Diagnosis not present

## 2019-05-12 DIAGNOSIS — M5416 Radiculopathy, lumbar region: Secondary | ICD-10-CM | POA: Diagnosis not present

## 2019-05-12 DIAGNOSIS — K2100 Gastro-esophageal reflux disease with esophagitis, without bleeding: Secondary | ICD-10-CM | POA: Diagnosis not present

## 2019-05-12 DIAGNOSIS — I8500 Esophageal varices without bleeding: Secondary | ICD-10-CM | POA: Diagnosis not present

## 2019-05-12 DIAGNOSIS — E785 Hyperlipidemia, unspecified: Secondary | ICD-10-CM | POA: Diagnosis not present

## 2019-05-12 DIAGNOSIS — I69354 Hemiplegia and hemiparesis following cerebral infarction affecting left non-dominant side: Secondary | ICD-10-CM | POA: Diagnosis not present

## 2019-05-12 DIAGNOSIS — F1019 Alcohol abuse with unspecified alcohol-induced disorder: Secondary | ICD-10-CM | POA: Diagnosis not present

## 2019-05-12 DIAGNOSIS — B9681 Helicobacter pylori [H. pylori] as the cause of diseases classified elsewhere: Secondary | ICD-10-CM | POA: Diagnosis not present

## 2019-05-12 DIAGNOSIS — E559 Vitamin D deficiency, unspecified: Secondary | ICD-10-CM | POA: Diagnosis not present

## 2019-05-12 DIAGNOSIS — E1140 Type 2 diabetes mellitus with diabetic neuropathy, unspecified: Secondary | ICD-10-CM | POA: Diagnosis not present

## 2019-05-12 DIAGNOSIS — G5600 Carpal tunnel syndrome, unspecified upper limb: Secondary | ICD-10-CM | POA: Diagnosis not present

## 2019-05-12 DIAGNOSIS — J309 Allergic rhinitis, unspecified: Secondary | ICD-10-CM | POA: Diagnosis not present

## 2019-05-12 DIAGNOSIS — F419 Anxiety disorder, unspecified: Secondary | ICD-10-CM | POA: Diagnosis not present

## 2019-05-12 DIAGNOSIS — J449 Chronic obstructive pulmonary disease, unspecified: Secondary | ICD-10-CM | POA: Diagnosis not present

## 2019-05-12 DIAGNOSIS — M5412 Radiculopathy, cervical region: Secondary | ICD-10-CM | POA: Diagnosis not present

## 2019-05-12 DIAGNOSIS — E039 Hypothyroidism, unspecified: Secondary | ICD-10-CM | POA: Diagnosis not present

## 2019-05-12 DIAGNOSIS — M8580 Other specified disorders of bone density and structure, unspecified site: Secondary | ICD-10-CM | POA: Diagnosis not present

## 2019-05-12 DIAGNOSIS — K7030 Alcoholic cirrhosis of liver without ascites: Secondary | ICD-10-CM | POA: Diagnosis not present

## 2019-05-12 DIAGNOSIS — F339 Major depressive disorder, recurrent, unspecified: Secondary | ICD-10-CM | POA: Diagnosis not present

## 2019-05-19 DIAGNOSIS — D508 Other iron deficiency anemias: Secondary | ICD-10-CM | POA: Diagnosis not present

## 2019-05-27 DIAGNOSIS — D500 Iron deficiency anemia secondary to blood loss (chronic): Secondary | ICD-10-CM | POA: Diagnosis not present

## 2019-05-27 DIAGNOSIS — K7030 Alcoholic cirrhosis of liver without ascites: Secondary | ICD-10-CM | POA: Diagnosis not present

## 2019-05-27 DIAGNOSIS — N183 Chronic kidney disease, stage 3 unspecified: Secondary | ICD-10-CM | POA: Diagnosis not present

## 2019-05-27 DIAGNOSIS — J410 Simple chronic bronchitis: Secondary | ICD-10-CM | POA: Diagnosis not present

## 2019-05-27 DIAGNOSIS — F1021 Alcohol dependence, in remission: Secondary | ICD-10-CM | POA: Diagnosis not present

## 2019-05-27 DIAGNOSIS — E440 Moderate protein-calorie malnutrition: Secondary | ICD-10-CM | POA: Diagnosis not present

## 2019-05-27 DIAGNOSIS — E039 Hypothyroidism, unspecified: Secondary | ICD-10-CM | POA: Diagnosis not present

## 2019-05-27 DIAGNOSIS — I129 Hypertensive chronic kidney disease with stage 1 through stage 4 chronic kidney disease, or unspecified chronic kidney disease: Secondary | ICD-10-CM | POA: Diagnosis not present

## 2019-05-27 DIAGNOSIS — E1151 Type 2 diabetes mellitus with diabetic peripheral angiopathy without gangrene: Secondary | ICD-10-CM | POA: Diagnosis not present

## 2019-05-27 DIAGNOSIS — I8511 Secondary esophageal varices with bleeding: Secondary | ICD-10-CM | POA: Diagnosis not present

## 2019-05-27 DIAGNOSIS — F1019 Alcohol abuse with unspecified alcohol-induced disorder: Secondary | ICD-10-CM | POA: Diagnosis not present

## 2019-06-29 DIAGNOSIS — D500 Iron deficiency anemia secondary to blood loss (chronic): Secondary | ICD-10-CM | POA: Diagnosis not present

## 2019-06-29 DIAGNOSIS — E039 Hypothyroidism, unspecified: Secondary | ICD-10-CM | POA: Diagnosis not present

## 2019-06-29 DIAGNOSIS — K7030 Alcoholic cirrhosis of liver without ascites: Secondary | ICD-10-CM | POA: Diagnosis not present

## 2019-07-02 DIAGNOSIS — F1021 Alcohol dependence, in remission: Secondary | ICD-10-CM | POA: Diagnosis not present

## 2019-07-02 DIAGNOSIS — E1142 Type 2 diabetes mellitus with diabetic polyneuropathy: Secondary | ICD-10-CM | POA: Diagnosis not present

## 2019-07-02 DIAGNOSIS — K7030 Alcoholic cirrhosis of liver without ascites: Secondary | ICD-10-CM | POA: Diagnosis not present

## 2019-07-02 DIAGNOSIS — I129 Hypertensive chronic kidney disease with stage 1 through stage 4 chronic kidney disease, or unspecified chronic kidney disease: Secondary | ICD-10-CM | POA: Diagnosis not present

## 2019-07-02 DIAGNOSIS — N183 Chronic kidney disease, stage 3 unspecified: Secondary | ICD-10-CM | POA: Diagnosis not present

## 2019-07-02 DIAGNOSIS — J410 Simple chronic bronchitis: Secondary | ICD-10-CM | POA: Diagnosis not present

## 2019-07-02 DIAGNOSIS — E871 Hypo-osmolality and hyponatremia: Secondary | ICD-10-CM | POA: Diagnosis not present

## 2019-07-02 DIAGNOSIS — D649 Anemia, unspecified: Secondary | ICD-10-CM | POA: Diagnosis not present

## 2019-07-02 DIAGNOSIS — E039 Hypothyroidism, unspecified: Secondary | ICD-10-CM | POA: Diagnosis not present

## 2019-07-02 DIAGNOSIS — Z794 Long term (current) use of insulin: Secondary | ICD-10-CM | POA: Diagnosis not present

## 2019-07-27 DIAGNOSIS — N183 Chronic kidney disease, stage 3 unspecified: Secondary | ICD-10-CM | POA: Diagnosis not present

## 2019-07-27 DIAGNOSIS — I129 Hypertensive chronic kidney disease with stage 1 through stage 4 chronic kidney disease, or unspecified chronic kidney disease: Secondary | ICD-10-CM | POA: Diagnosis not present

## 2019-07-27 DIAGNOSIS — D649 Anemia, unspecified: Secondary | ICD-10-CM | POA: Diagnosis not present

## 2019-07-29 DIAGNOSIS — K7030 Alcoholic cirrhosis of liver without ascites: Secondary | ICD-10-CM | POA: Diagnosis not present

## 2019-07-29 DIAGNOSIS — I8511 Secondary esophageal varices with bleeding: Secondary | ICD-10-CM | POA: Diagnosis not present

## 2019-07-29 DIAGNOSIS — Z794 Long term (current) use of insulin: Secondary | ICD-10-CM | POA: Diagnosis not present

## 2019-07-29 DIAGNOSIS — E1165 Type 2 diabetes mellitus with hyperglycemia: Secondary | ICD-10-CM | POA: Diagnosis not present

## 2019-07-29 DIAGNOSIS — F1021 Alcohol dependence, in remission: Secondary | ICD-10-CM | POA: Diagnosis not present

## 2019-07-29 DIAGNOSIS — N183 Chronic kidney disease, stage 3 unspecified: Secondary | ICD-10-CM | POA: Diagnosis not present

## 2019-07-29 DIAGNOSIS — J410 Simple chronic bronchitis: Secondary | ICD-10-CM | POA: Diagnosis not present

## 2019-07-29 DIAGNOSIS — R1314 Dysphagia, pharyngoesophageal phase: Secondary | ICD-10-CM | POA: Diagnosis not present

## 2019-07-29 DIAGNOSIS — I129 Hypertensive chronic kidney disease with stage 1 through stage 4 chronic kidney disease, or unspecified chronic kidney disease: Secondary | ICD-10-CM | POA: Diagnosis not present

## 2019-07-29 DIAGNOSIS — F17200 Nicotine dependence, unspecified, uncomplicated: Secondary | ICD-10-CM | POA: Diagnosis not present

## 2019-07-29 DIAGNOSIS — F339 Major depressive disorder, recurrent, unspecified: Secondary | ICD-10-CM | POA: Diagnosis not present

## 2019-07-29 DIAGNOSIS — E1169 Type 2 diabetes mellitus with other specified complication: Secondary | ICD-10-CM | POA: Diagnosis not present

## 2019-08-05 DIAGNOSIS — I129 Hypertensive chronic kidney disease with stage 1 through stage 4 chronic kidney disease, or unspecified chronic kidney disease: Secondary | ICD-10-CM | POA: Diagnosis not present

## 2019-08-05 DIAGNOSIS — F1021 Alcohol dependence, in remission: Secondary | ICD-10-CM | POA: Diagnosis not present

## 2019-08-05 DIAGNOSIS — Z794 Long term (current) use of insulin: Secondary | ICD-10-CM | POA: Diagnosis not present

## 2019-08-05 DIAGNOSIS — F17210 Nicotine dependence, cigarettes, uncomplicated: Secondary | ICD-10-CM | POA: Diagnosis not present

## 2019-08-05 DIAGNOSIS — E1165 Type 2 diabetes mellitus with hyperglycemia: Secondary | ICD-10-CM | POA: Diagnosis not present

## 2019-08-05 DIAGNOSIS — J410 Simple chronic bronchitis: Secondary | ICD-10-CM | POA: Diagnosis not present

## 2019-08-05 DIAGNOSIS — G4709 Other insomnia: Secondary | ICD-10-CM | POA: Diagnosis not present

## 2019-08-05 DIAGNOSIS — N183 Chronic kidney disease, stage 3 unspecified: Secondary | ICD-10-CM | POA: Diagnosis not present

## 2019-08-05 DIAGNOSIS — K7030 Alcoholic cirrhosis of liver without ascites: Secondary | ICD-10-CM | POA: Diagnosis not present

## 2019-08-27 DIAGNOSIS — C44529 Squamous cell carcinoma of skin of other part of trunk: Secondary | ICD-10-CM | POA: Diagnosis not present

## 2019-09-02 DIAGNOSIS — K7030 Alcoholic cirrhosis of liver without ascites: Secondary | ICD-10-CM | POA: Diagnosis not present

## 2019-09-02 DIAGNOSIS — F1021 Alcohol dependence, in remission: Secondary | ICD-10-CM | POA: Diagnosis not present

## 2019-09-02 DIAGNOSIS — Z794 Long term (current) use of insulin: Secondary | ICD-10-CM | POA: Diagnosis not present

## 2019-09-02 DIAGNOSIS — N183 Chronic kidney disease, stage 3 unspecified: Secondary | ICD-10-CM | POA: Diagnosis not present

## 2019-09-02 DIAGNOSIS — E1165 Type 2 diabetes mellitus with hyperglycemia: Secondary | ICD-10-CM | POA: Diagnosis not present

## 2019-09-02 DIAGNOSIS — I129 Hypertensive chronic kidney disease with stage 1 through stage 4 chronic kidney disease, or unspecified chronic kidney disease: Secondary | ICD-10-CM | POA: Diagnosis not present

## 2019-09-02 DIAGNOSIS — F17210 Nicotine dependence, cigarettes, uncomplicated: Secondary | ICD-10-CM | POA: Diagnosis not present

## 2019-09-02 DIAGNOSIS — M8080XA Other osteoporosis with current pathological fracture, unspecified site, initial encounter for fracture: Secondary | ICD-10-CM | POA: Diagnosis not present

## 2019-09-02 DIAGNOSIS — E871 Hypo-osmolality and hyponatremia: Secondary | ICD-10-CM | POA: Diagnosis not present

## 2019-09-02 DIAGNOSIS — E1142 Type 2 diabetes mellitus with diabetic polyneuropathy: Secondary | ICD-10-CM | POA: Diagnosis not present

## 2019-09-02 DIAGNOSIS — J410 Simple chronic bronchitis: Secondary | ICD-10-CM | POA: Diagnosis not present

## 2019-09-09 DIAGNOSIS — C44529 Squamous cell carcinoma of skin of other part of trunk: Secondary | ICD-10-CM | POA: Diagnosis not present

## 2019-10-02 DIAGNOSIS — Z794 Long term (current) use of insulin: Secondary | ICD-10-CM | POA: Diagnosis not present

## 2019-10-02 DIAGNOSIS — E871 Hypo-osmolality and hyponatremia: Secondary | ICD-10-CM | POA: Diagnosis not present

## 2019-10-02 DIAGNOSIS — E1142 Type 2 diabetes mellitus with diabetic polyneuropathy: Secondary | ICD-10-CM | POA: Diagnosis not present

## 2019-10-16 DIAGNOSIS — F17210 Nicotine dependence, cigarettes, uncomplicated: Secondary | ICD-10-CM | POA: Diagnosis not present

## 2019-10-16 DIAGNOSIS — I129 Hypertensive chronic kidney disease with stage 1 through stage 4 chronic kidney disease, or unspecified chronic kidney disease: Secondary | ICD-10-CM | POA: Diagnosis not present

## 2019-10-16 DIAGNOSIS — E1169 Type 2 diabetes mellitus with other specified complication: Secondary | ICD-10-CM | POA: Diagnosis not present

## 2019-10-16 DIAGNOSIS — Z794 Long term (current) use of insulin: Secondary | ICD-10-CM | POA: Diagnosis not present

## 2019-10-16 DIAGNOSIS — E785 Hyperlipidemia, unspecified: Secondary | ICD-10-CM | POA: Diagnosis not present

## 2019-10-16 DIAGNOSIS — K7030 Alcoholic cirrhosis of liver without ascites: Secondary | ICD-10-CM | POA: Diagnosis not present

## 2019-10-16 DIAGNOSIS — E1142 Type 2 diabetes mellitus with diabetic polyneuropathy: Secondary | ICD-10-CM | POA: Diagnosis not present

## 2019-10-16 DIAGNOSIS — E039 Hypothyroidism, unspecified: Secondary | ICD-10-CM | POA: Diagnosis not present

## 2019-10-16 DIAGNOSIS — N183 Chronic kidney disease, stage 3 unspecified: Secondary | ICD-10-CM | POA: Diagnosis not present

## 2019-10-16 DIAGNOSIS — J410 Simple chronic bronchitis: Secondary | ICD-10-CM | POA: Diagnosis not present

## 2019-10-16 DIAGNOSIS — F1021 Alcohol dependence, in remission: Secondary | ICD-10-CM | POA: Diagnosis not present

## 2019-10-16 DIAGNOSIS — E871 Hypo-osmolality and hyponatremia: Secondary | ICD-10-CM | POA: Diagnosis not present

## 2019-10-27 DIAGNOSIS — E1151 Type 2 diabetes mellitus with diabetic peripheral angiopathy without gangrene: Secondary | ICD-10-CM | POA: Diagnosis not present

## 2019-10-27 DIAGNOSIS — E039 Hypothyroidism, unspecified: Secondary | ICD-10-CM | POA: Diagnosis not present

## 2019-10-27 DIAGNOSIS — Z794 Long term (current) use of insulin: Secondary | ICD-10-CM | POA: Diagnosis not present

## 2019-10-27 DIAGNOSIS — E1142 Type 2 diabetes mellitus with diabetic polyneuropathy: Secondary | ICD-10-CM | POA: Diagnosis not present

## 2019-11-18 DIAGNOSIS — N761 Subacute and chronic vaginitis: Secondary | ICD-10-CM | POA: Diagnosis not present

## 2019-11-18 DIAGNOSIS — L538 Other specified erythematous conditions: Secondary | ICD-10-CM | POA: Diagnosis not present

## 2019-11-18 DIAGNOSIS — Z8619 Personal history of other infectious and parasitic diseases: Secondary | ICD-10-CM | POA: Diagnosis not present

## 2019-11-18 DIAGNOSIS — Z1231 Encounter for screening mammogram for malignant neoplasm of breast: Secondary | ICD-10-CM | POA: Diagnosis not present

## 2019-11-18 DIAGNOSIS — N898 Other specified noninflammatory disorders of vagina: Secondary | ICD-10-CM | POA: Diagnosis not present

## 2019-11-18 DIAGNOSIS — Z9079 Acquired absence of other genital organ(s): Secondary | ICD-10-CM | POA: Diagnosis not present

## 2019-11-18 DIAGNOSIS — Z124 Encounter for screening for malignant neoplasm of cervix: Secondary | ICD-10-CM | POA: Diagnosis not present

## 2019-11-18 DIAGNOSIS — Z01419 Encounter for gynecological examination (general) (routine) without abnormal findings: Secondary | ICD-10-CM | POA: Diagnosis not present

## 2019-11-18 DIAGNOSIS — Z90722 Acquired absence of ovaries, bilateral: Secondary | ICD-10-CM | POA: Diagnosis not present

## 2019-11-18 DIAGNOSIS — E119 Type 2 diabetes mellitus without complications: Secondary | ICD-10-CM | POA: Diagnosis not present

## 2019-11-18 DIAGNOSIS — R300 Dysuria: Secondary | ICD-10-CM | POA: Diagnosis not present

## 2019-11-18 DIAGNOSIS — Z1151 Encounter for screening for human papillomavirus (HPV): Secondary | ICD-10-CM | POA: Diagnosis not present

## 2019-11-18 DIAGNOSIS — B372 Candidiasis of skin and nail: Secondary | ICD-10-CM | POA: Diagnosis not present

## 2019-12-09 DEATH — deceased
